# Patient Record
Sex: Female | Born: 1970
Health system: Southern US, Community
[De-identification: ages and names within clinical notes are randomized; demographics above are authoritative.]

## PROBLEM LIST (undated history)

## (undated) DIAGNOSIS — J309 Allergic rhinitis, unspecified: Secondary | ICD-10-CM

## (undated) DIAGNOSIS — I10 Essential (primary) hypertension: Secondary | ICD-10-CM

## (undated) DIAGNOSIS — J45909 Unspecified asthma, uncomplicated: Secondary | ICD-10-CM

## (undated) DIAGNOSIS — K2100 Gastro-esophageal reflux disease with esophagitis, without bleeding: Secondary | ICD-10-CM

## (undated) DIAGNOSIS — K219 Gastro-esophageal reflux disease without esophagitis: Secondary | ICD-10-CM

## (undated) DIAGNOSIS — K769 Liver disease, unspecified: Secondary | ICD-10-CM

## (undated) DIAGNOSIS — F419 Anxiety disorder, unspecified: Secondary | ICD-10-CM

## (undated) HISTORY — DX: Allergic rhinitis, unspecified: J30.9

## (undated) HISTORY — DX: Unspecified asthma, uncomplicated: J45.909

## (undated) HISTORY — PX: OTHER SURGICAL HISTORY: SHX169

## (undated) HISTORY — PX: CHOLECYSTECTOMY: SHX55

## (undated) HISTORY — DX: Liver disease, unspecified: K76.9

## (undated) HISTORY — DX: Gastro-esophageal reflux disease with esophagitis, without bleeding: K21.00

## (undated) HISTORY — DX: Gastro-esophageal reflux disease with esophagitis: K21.0

## (undated) HISTORY — DX: Anxiety disorder, unspecified: F41.9

---

## 1996-05-31 HISTORY — PX: TUBAL LIGATION: SHX77

## 2000-12-07 ENCOUNTER — Other Ambulatory Visit: Admission: RE | Admit: 2000-12-07 | Discharge: 2000-12-07 | Payer: Self-pay | Admitting: Family Medicine

## 2004-11-16 ENCOUNTER — Emergency Department: Payer: Self-pay | Admitting: Emergency Medicine

## 2005-11-18 ENCOUNTER — Emergency Department: Payer: Self-pay | Admitting: Emergency Medicine

## 2006-09-23 DIAGNOSIS — J309 Allergic rhinitis, unspecified: Secondary | ICD-10-CM | POA: Insufficient documentation

## 2006-09-23 DIAGNOSIS — J45909 Unspecified asthma, uncomplicated: Secondary | ICD-10-CM | POA: Insufficient documentation

## 2006-09-23 DIAGNOSIS — K2100 Gastro-esophageal reflux disease with esophagitis, without bleeding: Secondary | ICD-10-CM | POA: Insufficient documentation

## 2009-05-27 ENCOUNTER — Ambulatory Visit: Payer: Self-pay | Admitting: Family Medicine

## 2009-10-28 ENCOUNTER — Ambulatory Visit: Payer: Self-pay

## 2011-11-17 ENCOUNTER — Emergency Department: Payer: Self-pay | Admitting: Emergency Medicine

## 2011-11-17 LAB — HEPATIC FUNCTION PANEL A (ARMC)
Albumin: 3.6 g/dL (ref 3.4–5.0)
Bilirubin, Direct: <0.1 mg/dL (ref 0.00–0.20)
Bilirubin,Total: 0.2 mg/dL (ref 0.2–1.0)
SGOT(AST): 34 U/L (ref 15–37)
SGPT (ALT): 44 U/L

## 2011-11-17 LAB — URINALYSIS, COMPLETE
Glucose,UR: NEGATIVE mg/dL (ref 0–75)
Leukocyte Esterase: NEGATIVE
Nitrite: NEGATIVE
RBC,UR: 1 /HPF (ref 0–5)
Specific Gravity: 1.013 (ref 1.003–1.030)
WBC UR: NONE SEEN /HPF (ref 0–5)

## 2011-11-17 LAB — CBC
HGB: 15 g/dL (ref 12.0–16.0)
MCH: 31.2 pg (ref 26.0–34.0)
MCV: 93 fL (ref 80–100)
RDW: 13.4 % (ref 11.5–14.5)
WBC: 10.9 10*3/uL (ref 3.6–11.0)

## 2011-11-17 LAB — BASIC METABOLIC PANEL
BUN: 12 mg/dL (ref 7–18)
Calcium, Total: 8.8 mg/dL (ref 8.5–10.1)
Chloride: 108 mmol/L — ABNORMAL HIGH (ref 98–107)
Co2: 23 mmol/L (ref 21–32)
Creatinine: 0.68 mg/dL (ref 0.60–1.30)
EGFR (Non-African Amer.): >60
Glucose: 103 mg/dL — ABNORMAL HIGH (ref 65–99)

## 2011-11-17 LAB — PREGNANCY, URINE: Pregnancy Test, Urine: NEGATIVE m[IU]/mL

## 2012-11-01 ENCOUNTER — Ambulatory Visit: Payer: Self-pay | Admitting: Family Medicine

## 2014-09-22 NOTE — Consult Note (Signed)
PATIENT NAME:  Hannah Roberson, Hannah Roberson MR#:  [id number] DATE OF BIRTH:  Jul 08, 1970  DATE OF CONSULTATION:  11/17/2011  REFERRING PHYSICIAN:  ED MD CONSULTING PHYSICIAN:  Consuela Mimes, MD  HISTORY OF PRESENT ILLNESS: Hannah Roberson is a 44 year old white female with a three day history of lower back pain that radiates around her flank and onto her abdomen in a tire-like distribution. She says that she has these types of pains typically but they typically last less than a day. She was concerned because this episode lasted for three days. She does not have any nausea or any vomiting. She had a normal bowel movement yesterday and continues to pass flatus. She denies all genitourinary symptoms, fever, chills, and night sweats. She also denies change in the color of her urine or her stool or her eyes or her skin. The back portion of her pain does not really radiate anywhere other than around her flank and all over her upper abdomen.   PAST MEDICAL HISTORY:  1. History of asthmatic bronchitis with bronchospasm.  2. 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   MEDICATIONS:  1. Advil 800 mg q.8 hours. 2. Omeprazole 20 mg daily.  3. ProAir HFA 2 puffs daily.   ALLERGIES: Shellfish and fish.   SOCIAL HISTORY: The patient has a long-term (greater than 20 year monogamous) relationship and lives with that individual and one of their children. She works in Programmer, applications for [employer] She smokes a half a pack of cigarettes per day and does not drink alcohol.   REVIEW OF SYSTEMS: Negative for 10 systems except as mentioned above in the history of present illness. Specifically, see all of the negative symptoms in the history of present illness.   FAMILY HISTORY: Noncontributory.   PHYSICAL EXAMINATION:   GENERAL: Pleasant, obese young female in no distress standing up in  the Emergency Department room. Height 5 feet 2 inches, weight 200 pounds, BMI 36.6.   VITAL SIGNS: Temperature 97.0, pulse 94, respirations 18, blood pressure 184/90, oxygen saturation 100% on room air.   HEENT: Pupils equally round and reactive to light. Extraocular movements intact. Sclerae nonicteric. Oropharynx clear. Mucous membranes moist. Hearing is intact to voice.   NECK: Supple with no tracheal deviation or jugular venous distention.   HEART: Regular rate and rhythm with no murmurs or rubs.   LUNGS: Clear to auscultation with normal respiratory effort bilaterally.   ABDOMEN: Obese with large wide scar in the right subcostal region extending over to the flank. The abdomen is entirely nontender and nondistended.   EXTREMITIES: No edema with normal capillary refill bilaterally.   NEUROLOGIC: Motor and sensation grossly intact.   PSYCHIATRIC: Alert and oriented x4. Appropriate affect. The patient does express reasonable concern about her pain as she says that this is the same type of pain that she had when her bile duct had pathology and she required bile duct surgery.   OTHER STUDIES: White blood cell count 10.9, hemoglobin 15, hematocrit 44.5%, platelet count 266,000. Urinalysis normal. Urine pregnancy test negative. Electrolytes normal. Hepatic profile including SGOT, SGPT, alkaline phosphatase, and total bilirubin are all normal. Specifically, her total bilirubin is 0.2.   CT scan of the abdomen and pelvis with contrast was obtained and there appeared to be a small bowel anastomotic suture line in the anterior midabdomen with some focal bowel wall thickening. There was  no evidence of bowel obstruction. I reviewed the CT scan. What was not mentioned on the report was that the patient clearly has at least a left hepaticojejunostomy Roux-en-Y limb with a coronary marker attached to the anterior abdominal wall and there is air within this limb and the limb itself is quite decompressed.  There is also left hepatic ductal dilatation with air within the left hepatic duct and very little if any contrast within the left hepatic lobe (on the venous phase there is no left portal vein seen).   ASSESSMENT:  1. 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 I did not appreciate the subtle findings of thickening of the bowel wall at the enteroenterostomy, however.  2. Back pain likely due to some other etiology than her previous complex biliary reconstructive surgery.   I discussed this case with an attending surgeon at [facility] and she suggested that if the patient requires a follow-up that she follow-up with Dr. Estill Dooms in the future.   ____________________________ Consuela Mimes, MD wfm:drc D: 11/17/2011 14:22:38 ET T: 11/17/2011 14:32:31 ET JOB#: [id number]  cc: Consuela Mimes, MD, <Dictator> Dr. Estill Dooms, [place] MD ELECTRONICALLY SIGNED 11/18/2011 7:36

## 2014-09-22 NOTE — Consult Note (Signed)
Brief Consult Note: Diagnosis: back pain, unrelated to complex biliary reconstructions at [facility]   Patient was seen by consultant.   Consult note dictated.   Discussed with Attending MD.  Electronic Signatures: Consuela Mimes (MD)  (Signed 19-Jun-13 14:24)  Authored: Brief Consult Note   Last Updated: 19-Jun-13 14:24 by Consuela Mimes (MD)

## 2014-09-27 DIAGNOSIS — R351 Nocturia: Secondary | ICD-10-CM | POA: Insufficient documentation

## 2014-09-27 DIAGNOSIS — N3281 Overactive bladder: Secondary | ICD-10-CM | POA: Insufficient documentation

## 2014-09-27 DIAGNOSIS — R350 Frequency of micturition: Secondary | ICD-10-CM | POA: Insufficient documentation

## 2014-09-27 DIAGNOSIS — E66811 Obesity, class 1: Secondary | ICD-10-CM | POA: Insufficient documentation

## 2015-05-04 ENCOUNTER — Other Ambulatory Visit: Payer: Self-pay | Admitting: Family Medicine

## 2015-05-19 ENCOUNTER — Encounter: Payer: Self-pay | Admitting: Family Medicine

## 2015-05-19 ENCOUNTER — Ambulatory Visit (INDEPENDENT_AMBULATORY_CARE_PROVIDER_SITE_OTHER): Payer: BLUE CROSS/BLUE SHIELD | Admitting: Family Medicine

## 2015-05-19 ENCOUNTER — Other Ambulatory Visit: Payer: Self-pay | Admitting: Family Medicine

## 2015-05-19 VITALS — BP 112/82 | HR 68 | Temp 98.2°F | Resp 16 | Wt 239.8 lb

## 2015-05-19 DIAGNOSIS — N3090 Cystitis, unspecified without hematuria: Secondary | ICD-10-CM

## 2015-05-19 DIAGNOSIS — M545 Low back pain, unspecified: Secondary | ICD-10-CM

## 2015-05-19 LAB — POCT URINALYSIS DIPSTICK
Bilirubin, UA: NEGATIVE
Blood, UA: NEGATIVE
GLUCOSE UA: NEGATIVE
Ketones, UA: NEGATIVE
Leukocytes, UA: NEGATIVE
NITRITE UA: NEGATIVE
Protein, UA: NEGATIVE
Spec Grav, UA: 1.01
Urobilinogen, UA: 0.2
pH, UA: 5

## 2015-05-19 MED ORDER — NITROFURANTOIN MONOHYD MACRO 100 MG PO CAPS: 100.0000 mg | ORAL_CAPSULE | Freq: Two times a day (BID) | ORAL | Status: DC

## 2015-05-19 NOTE — Progress Notes (Signed)
Subjective:     Patient ID: Hannah Roberson, female   DOB: [birth year], 44 y.o.   MRN: [id number]  HPI  Chief Complaint  Patient presents with  . Back Pain    Patient comes in office today with concerns of urinary tract infection symptoms. Patient reports for the past 3 days she has felt "feverish", lower back pain, bilateral flank pain, pressure to void and urinary frequency. Patient states that she has taken otc Advil  Last documented UTI in 2013. States she had at least 20 oz of water today.   Review of Systems  Genitourinary: Negative for vaginal discharge.       Reports normal gyn exam 7 months ago.       Objective:   Physical Exam  Constitutional: She appears well-developed and well-nourished. No distress.  Genitourinary:  No cva tenderness       Assessment:    1. Bilateral low back pain without sciatica - POCT urinalysis dipstick  2. Cystitis - Urine culture - nitrofurantoin, macrocrystal-monohydrate, (MACROBID) 100 MG capsule; Take 1 capsule (100 mg total) by mouth 2 (two) times daily.  Dispense: 14 capsule; Refill: 0    Plan:    Further f/u pending culture report.

## 2015-05-19 NOTE — Patient Instructions (Signed)
We will call you with the urine culture result. 

## 2015-05-21 LAB — URINE CULTURE

## 2015-05-23 ENCOUNTER — Other Ambulatory Visit: Payer: Self-pay | Admitting: Family Medicine

## 2015-05-23 ENCOUNTER — Telehealth: Payer: Self-pay

## 2015-05-23 DIAGNOSIS — B3731 Acute candidiasis of vulva and vagina: Secondary | ICD-10-CM

## 2015-05-23 DIAGNOSIS — B373 Candidiasis of vulva and vagina: Secondary | ICD-10-CM

## 2015-05-23 DIAGNOSIS — N3090 Cystitis, unspecified without hematuria: Secondary | ICD-10-CM

## 2015-05-23 MED ORDER — FLUCONAZOLE 150 MG PO TABS: 150.0000 mg | ORAL_TABLET | Freq: Once | ORAL | Status: DC

## 2015-05-23 MED ORDER — SULFAMETHOXAZOLE-TRIMETHOPRIM 800-160 MG PO TABS: 1.0000 | ORAL_TABLET | Freq: Two times a day (BID) | ORAL | Status: DC

## 2015-05-23 NOTE — Telephone Encounter (Signed)
Advised patient as below. Patient reports that she has a yeast infection while on the other abx. She is requesting that medication be sent into the pharmacy. Thanks!

## 2015-05-23 NOTE — Telephone Encounter (Signed)
Diflucan sent in

## 2015-05-23 NOTE — Telephone Encounter (Signed)
[id number] 

## 2015-05-23 NOTE — Telephone Encounter (Signed)
-----   Message from Carmon Ginsberg, [place] sent at 05/23/2015  7:50 AM EST ----- Mild growth of Proteus which is resistant to Macrobid. Will change your antibiotic and send it in.

## 2015-05-23 NOTE — Telephone Encounter (Signed)
Patient has been advised. KW 

## 2015-06-05 ENCOUNTER — Emergency Department: Payer: BLUE CROSS/BLUE SHIELD

## 2015-06-05 ENCOUNTER — Ambulatory Visit: Payer: BLUE CROSS/BLUE SHIELD | Admitting: Family Medicine

## 2015-06-05 ENCOUNTER — Encounter: Payer: Self-pay | Admitting: Emergency Medicine

## 2015-06-05 ENCOUNTER — Emergency Department
Admission: EM | Admit: 2015-06-05 | Discharge: 2015-06-05 | Disposition: A | Discharge status: Home / Self Care | Payer: BLUE CROSS/BLUE SHIELD | Attending: Emergency Medicine | Admitting: Emergency Medicine

## 2015-06-05 DIAGNOSIS — Z3202 Encounter for pregnancy test, result negative: Secondary | ICD-10-CM | POA: Diagnosis not present

## 2015-06-05 DIAGNOSIS — R000 Tachycardia, unspecified: Secondary | ICD-10-CM | POA: Diagnosis not present

## 2015-06-05 DIAGNOSIS — R109 Unspecified abdominal pain: Secondary | ICD-10-CM | POA: Diagnosis present

## 2015-06-05 DIAGNOSIS — R1084 Generalized abdominal pain: Secondary | ICD-10-CM | POA: Diagnosis not present

## 2015-06-05 DIAGNOSIS — Z87891 Personal history of nicotine dependence: Secondary | ICD-10-CM | POA: Diagnosis not present

## 2015-06-05 DIAGNOSIS — Z79899 Other long term (current) drug therapy: Secondary | ICD-10-CM | POA: Diagnosis not present

## 2015-06-05 DIAGNOSIS — E669 Obesity, unspecified: Secondary | ICD-10-CM | POA: Diagnosis not present

## 2015-06-05 LAB — COMPREHENSIVE METABOLIC PANEL
ALBUMIN: 4 g/dL (ref 3.5–5.0)
ALK PHOS: 121 U/L (ref 38–126)
ALT: 57 U/L — AB (ref 14–54)
AST: 39 U/L (ref 15–41)
Anion gap: 9 (ref 5–15)
BUN: 6 mg/dL (ref 6–20)
CALCIUM: 9 mg/dL (ref 8.9–10.3)
CHLORIDE: 103 mmol/L (ref 101–111)
CO2: 25 mmol/L (ref 22–32)
CREATININE: 0.54 mg/dL (ref 0.44–1.00)
GFR calc Af Amer: >60 mL/min (ref >60)
GFR calc non Af Amer: >60 mL/min (ref >60)
GLUCOSE: 119 mg/dL — AB (ref 65–99)
Potassium: 3.8 mmol/L (ref 3.5–5.1)
SODIUM: 137 mmol/L (ref 135–145)
Total Bilirubin: 1 mg/dL (ref 0.3–1.2)
Total Protein: 7.8 g/dL (ref 6.5–8.1)

## 2015-06-05 LAB — URINALYSIS COMPLETE WITH MICROSCOPIC (ARMC ONLY)
BACTERIA UA: NONE SEEN
Bilirubin Urine: NEGATIVE
GLUCOSE, UA: NEGATIVE mg/dL
Ketones, ur: NEGATIVE mg/dL
Leukocytes, UA: NEGATIVE
NITRITE: NEGATIVE
PROTEIN: NEGATIVE mg/dL
SPECIFIC GRAVITY, URINE: 1.015 (ref 1.005–1.030)
pH: 7 (ref 5.0–8.0)

## 2015-06-05 LAB — LIPASE, BLOOD: LIPASE: 20 U/L (ref 11–51)

## 2015-06-05 LAB — CBC
HCT: 36.4 % (ref 35.0–47.0)
HEMOGLOBIN: 11.7 g/dL — AB (ref 12.0–16.0)
MCH: 25.5 pg — AB (ref 26.0–34.0)
MCHC: 32.2 g/dL (ref 32.0–36.0)
MCV: 79.4 fL — ABNORMAL LOW (ref 80.0–100.0)
PLATELETS: 310 10*3/uL (ref 150–440)
RBC: 4.58 MIL/uL (ref 3.80–5.20)
RDW: 15.3 % — ABNORMAL HIGH (ref 11.5–14.5)
WBC: 18.1 10*3/uL — AB (ref 3.6–11.0)

## 2015-06-05 LAB — PREGNANCY, URINE: PREG TEST UR: NEGATIVE

## 2015-06-05 MED ORDER — HYDROMORPHONE HCL 1 MG/ML IJ SOLN
1.0000 mg | Freq: Once | INTRAMUSCULAR | Status: AC
  Administered 2015-06-05: 1 mg via INTRAVENOUS
  Filled 2015-06-05: qty 1

## 2015-06-05 MED ORDER — DICYCLOMINE HCL 10 MG PO CAPS: 10.0000 mg | ORAL_CAPSULE | Freq: Four times a day (QID) | ORAL | Status: DC

## 2015-06-05 MED ORDER — ONDANSETRON HCL 4 MG PO TABS: 4.0000 mg | ORAL_TABLET | Freq: Three times a day (TID) | ORAL | Status: DC | PRN

## 2015-06-05 MED ORDER — HYDROCODONE-ACETAMINOPHEN 5-325 MG PO TABS: 1.0000 | ORAL_TABLET | Freq: Four times a day (QID) | ORAL | Status: DC | PRN

## 2015-06-05 MED ORDER — ONDANSETRON HCL 4 MG/2ML IJ SOLN
4.0000 mg | Freq: Once | INTRAMUSCULAR | Status: AC
  Administered 2015-06-05: 4 mg via INTRAVENOUS
  Filled 2015-06-05: qty 2

## 2015-06-05 MED ORDER — IOHEXOL 300 MG/ML  SOLN
100.0000 mL | Freq: Once | INTRAMUSCULAR | Status: AC | PRN
  Administered 2015-06-05: 100 mL via INTRAVENOUS

## 2015-06-05 MED ORDER — IOHEXOL 240 MG/ML SOLN
25.0000 mL | Freq: Once | INTRAMUSCULAR | Status: AC | PRN
  Administered 2015-06-05: 25 mL via ORAL

## 2015-06-05 NOTE — ED Provider Notes (Signed)
[facility] Emergency Department Provider Note   ____________________________________________  Time seen: I have reviewed the triage vital signs and the triage nursing note.  HISTORY  Chief Complaint Abdominal Pain   Historian Patient  HPI Hannah Roberson is a 45 y.o. female who is here for mid abdomen sharp poking abdominal pain. She's also had abdominal swelling. She feels like her abdomen is tense. She's had some intermittent pain for a couple of days, but last 12 hours as been much worse. Of note she is recently been treated with a UTI, initially started on one and then called by her physician that there was resistance, she was placed onto a different about it. She does not remember the names.  No fever. No dysuria. She's had a history of 2 surgeries on her liver in the past. She's never had a bowel obstruction. Nothing makes the pain better or worse. Pain is rated as severe.    Past Medical History  Diagnosis Date  . Allergic rhinitis   . Asthma, extrinsic   . Esophagitis, reflux     Patient Active Problem List   Diagnosis Date Noted  . Allergic rhinitis 09/23/2006  . Asthma, exogenous 09/23/2006  . Esophagitis, reflux 09/23/2006    Past Surgical History  Procedure Laterality Date  . Cholecystectomy    . Tubal ligation  1998  . Bile duct obstruction      patient has had 2 surgeries on liver to obstruction     Current Outpatient Rx  Name  Route  Sig  Dispense  Refill  . albuterol (PROAIR HFA) 108 (90 BASE) MCG/ACT inhaler   Inhalation   Inhale 2 puffs into the lungs every 6 (six) hours as needed for wheezing or shortness of breath. Reported on 05/19/2015         . esomeprazole (NEXIUM) 40 MG capsule   Oral   Take 40 mg by mouth daily at 12 noon.         . Esomeprazole Magnesium (NEXIUM 24HR PO)   Oral   Take 44.6 mg by mouth daily as needed (for acid reflux). Takes OTC Nexium         . fluticasone (FLONASE) 50 MCG/ACT nasal  spray      PLACE 2 SPRAYS INTO EACH NOSTRIL DAILY   16 g   3   . dicyclomine (BENTYL) 10 MG capsule   Oral   Take 1 capsule (10 mg total) by mouth 4 (four) times daily.   56 capsule   0   . HYDROcodone-acetaminophen (NORCO/VICODIN) 5-325 MG tablet   Oral   Take 1 tablet by mouth every 6 (six) hours as needed for moderate pain.   8 tablet   0   . ondansetron (ZOFRAN) 4 MG tablet   Oral   Take 1 tablet (4 mg total) by mouth every 8 (eight) hours as needed for nausea or vomiting.   10 tablet   0     Allergies Review of patient's allergies indicates no known allergies.  Family History  Problem Relation Age of Onset  . Healthy Mother   . Diabetes Sister   . Diabetes Brother   . Cancer Maternal Grandfather     lung cancer    Social History Social History  Substance Use Topics  . Smoking status: Former Research scientist (life sciences)  . Smokeless tobacco: Never Used  . Alcohol Use: 0.0 oz/week    0 Standard drinks or equivalent per week    Review of Systems  Constitutional:  Negative for fever. Eyes: Negative for visual changes. ENT: Negative for sore throat. Cardiovascular: Negative for chest pain. Respiratory: Negative for shortness of breath. Gastrointestinal: Abdominal yesterday and so took a laxative pill, today she had a tiny bit of runny stool. No vomiting but positive for belching which makes her feel a little bit better.. Genitourinary: Negative for dysuria. Musculoskeletal: Negative for back pain. Skin: Negative for rash. Neurological: Negative for headache. 10 point Review of Systems otherwise negative ____________________________________________   PHYSICAL EXAM:  VITAL SIGNS: ED Triage Vitals  Enc Vitals Group     BP 06/05/15 1058 149/88 mmHg     Pulse Rate 06/05/15 1058 125     Resp 06/05/15 1058 20     Temp 06/05/15 1058 97.7 F (36.5 C)     Temp Source 06/05/15 1058 Oral     SpO2 06/05/15 1058 98 %     Weight 06/05/15 1058 230 lb (104.327 kg)     Height  06/05/15 1058 5\' 2"  (1.575 m)     Head Cir --      Peak Flow --      Pain Score --      Pain Loc --      Pain Edu? --      Excl. in [place]? --      Constitutional: Alert and oriented. Standing up walking around holding her abdomen and pain. Eyes: Conjunctivae are normal. PERRL. Normal extraocular movements. ENT   Head: Normocephalic and atraumatic.   Nose: No congestion/rhinnorhea.   Mouth/Throat: Mucous membranes are moist.   Neck: No stridor. Cardiovascular/Chest: Tachycardic, regular. No murmurs, rubs, or gallops. Respiratory: Normal respiratory effort without tachypnea nor retractions. Breath sounds are clear and equal bilaterally. No wheezes/rales/rhonchi. Gastrointestinal:  Obese but tender and somewhat distended. Positive for guarding and no rebound.  Genitourinary/rectal:Deferred Musculoskeletal: Nontender with normal range of motion in all extremities. No joint effusions.  No lower extremity tenderness.  No edema. Neurologic:  Normal speech and language. No gross or focal neurologic deficits are appreciated. Skin:  Skin is warm, dry and intact. No rash noted. Psychiatric: Mood and affect are normal. Speech and behavior are normal. Patient exhibits appropriate insight and judgment.  ____________________________________________   EKG I, Lisa Roca, MD, the attending physician have personally viewed and interpreted all ECGs.  122 bpm. Sinus tachycardia. Narrow QRS. Normal axis. Nonspecific ST and T-wave ____________________________________________  LABS (pertinent positives/negatives)  Lipase 20 White blood cell count 18.1, hemoglobin 11.7 and platelet count 310 Comprehensive metabolic panel without significant abnormality Urinalysis negative  ____________________________________________  RADIOLOGY All Xrays were viewed by me. Imaging interpreted by Radiologist.  Acute abdomen with chest: Negative abdominal rhythm distress. No acute cardio pulmonary  disease  CT abdomen and pelvis with contrast:  IMPRESSION: No acute abnormality identified in the abdomen and pelvis. There is no bowel obstruction or diverticulitis.  Fatty infiltration of liver.  Status post prior cholecystectomy with pneumobilia unchanged. __________________________________________  PROCEDURES  Procedure(s) performed: None  Critical Care performed: None  ____________________________________________   ED COURSE / ASSESSMENT AND PLAN  CONSULTATIONS: None  Pertinent labs & imaging results that were available during my care of the patient were reviewed by me and considered in my medical decision making (see chart for details).  Patient's history and abdominal exam are concerning for intra-abdominal abdominal medical/surgical emergency, with obstruction of the top my list. She doesn't have rebound, however and obtain a acute abdomen with chest to rule out free air and then if reassuring from that standpoint,  proceed with CT of abdomen and pelvis.  ----------------------------------------- 3:35 PM on 06/05/2015 -----------------------------------------  Imaging reviewed, and no acute findings to explain the patient's pain.   Patient feels much better at this point time. She actually states that she's had numerous episodes like this in the past, although this was more severe. Other times has happened after she was treated with antibiotics for urinary tract infection as well. We discussed the possibility of that display emesis, and recommended probiotic use.   We discussed GERD/stomach ulcer, and I will prescribe Bentyl and referred to gastroenterology.  We discussed the possibility of C. difficile, although she's not had any diarrhea, and no fever, and I asked her to return to the department if she has any diarrhea for testing of this.  Patient / Family / Caregiver informed of clinical course, medical decision-making process, and agree with  plan.    ___________________________________________   FINAL CLINICAL IMPRESSION(S) / ED DIAGNOSES   Final diagnoses:  Generalized abdominal pain       Lisa Roca, MD 06/05/15 1614

## 2015-06-05 NOTE — ED Notes (Signed)
Pt states abd pain radiating to her back, states it feels like a hot poker, states pain for 3 days but increasing in the past 12 hours, states she was just recently treated for a UTI and finished abx, states nausea from the pain, denies any blood in her urine but states she is on her period

## 2015-06-05 NOTE — Discharge Instructions (Signed)
You were evaluated for abdominal pain, and no certain cause was found, however your CT scan of the abdomen was reassuring.  We discussed you may want to try over-the-counter magnesium citrate for relief of constipation. We also discussed probiotics to help rebuild gut flora after recent antibiotic use.  Return to emergency department for any worsening condition including any fever, worsening pain, black or bloody diarrhea, or diarrhea at all, as you would need to have a test for the bacteria called C. Difficile.   Abdominal Pain, Adult Many things can cause belly (abdominal) pain. Most times, the belly pain is not dangerous. Many cases of belly pain can be watched and treated at home. HOME CARE   Do not take medicines that help you go poop (laxatives) unless told to by your doctor.  Only take medicine as told by your doctor.  Eat or drink as told by your doctor. Your doctor will tell you if you should be on a special diet. GET HELP IF:  You do not know what is causing your belly pain.  You have belly pain while you are sick to your stomach (nauseous) or have runny poop (diarrhea).  You have pain while you pee or poop.  Your belly pain wakes you up at night.  You have belly pain that gets worse or better when you eat.  You have belly pain that gets worse when you eat fatty foods.  You have a fever. GET HELP RIGHT AWAY IF:   The pain does not go away within 2 hours.  You keep throwing up (vomiting).  The pain changes and is only in the right or left part of the belly.  You have bloody or tarry looking poop. MAKE SURE YOU:   Understand these instructions.  Will watch your condition.  Will get help right away if you are not doing well or get worse.   This information is not intended to replace advice given to you by your health care provider. Make sure you discuss any questions you have with your health care provider.   Document Released: 11/03/2007 Document Revised:  06/07/2014 Document Reviewed: 01/24/2013 Elsevier Interactive Patient Education [employer].

## 2015-06-05 NOTE — ED Notes (Signed)
Says tx for uti recently, and now has severe abd painfor 2 days.

## 2015-06-12 ENCOUNTER — Telehealth: Payer: Self-pay | Admitting: Family Medicine

## 2015-06-12 NOTE — Telephone Encounter (Signed)
Pt stated that she would like Bob to return her call. Pt stated she went to the hospital last week and she would like him to check on something for her. Pt wouldn't be more detailed. Thanks TNP

## 2015-06-13 NOTE — Telephone Encounter (Signed)
Please review. Thanks!  

## 2015-06-13 NOTE — Telephone Encounter (Signed)
States abdominal pain is improved but due to hx of recurrence will be going back to [facility] Will let us know if she needs a referral.

## 2015-07-08 ENCOUNTER — Encounter: Payer: Self-pay | Admitting: Family Medicine

## 2015-09-30 ENCOUNTER — Other Ambulatory Visit: Payer: Self-pay | Admitting: Obstetrics and Gynecology

## 2015-09-30 DIAGNOSIS — Z1231 Encounter for screening mammogram for malignant neoplasm of breast: Secondary | ICD-10-CM

## 2015-10-08 ENCOUNTER — Ambulatory Visit
Admission: RE | Admit: 2015-10-08 | Discharge: 2015-10-08 | Disposition: A | Discharge status: Home / Self Care | Payer: BLUE CROSS/BLUE SHIELD | Source: Ambulatory Visit | Attending: Obstetrics and Gynecology | Admitting: Obstetrics and Gynecology

## 2015-10-08 DIAGNOSIS — Z1231 Encounter for screening mammogram for malignant neoplasm of breast: Secondary | ICD-10-CM

## 2016-07-12 ENCOUNTER — Encounter: Payer: Self-pay | Admitting: Family Medicine

## 2016-07-12 ENCOUNTER — Ambulatory Visit (INDEPENDENT_AMBULATORY_CARE_PROVIDER_SITE_OTHER): Payer: BLUE CROSS/BLUE SHIELD | Admitting: Family Medicine

## 2016-07-12 VITALS — BP 108/84 | HR 96 | Temp 98.3°F | Resp 20

## 2016-07-12 DIAGNOSIS — J4520 Mild intermittent asthma, uncomplicated: Secondary | ICD-10-CM

## 2016-07-12 DIAGNOSIS — J0110 Acute frontal sinusitis, unspecified: Secondary | ICD-10-CM | POA: Diagnosis not present

## 2016-07-12 MED ORDER — ALBUTEROL SULFATE HFA 108 (90 BASE) MCG/ACT IN AERS: 2.0000 | INHALATION_SPRAY | Freq: Four times a day (QID) | RESPIRATORY_TRACT | 5 refills | Status: DC | PRN

## 2016-07-12 MED ORDER — PREDNISONE 20 MG PO TABS: ORAL_TABLET | ORAL | 1 refills | Status: DC

## 2016-07-12 MED ORDER — AMOXICILLIN-POT CLAVULANATE 875-125 MG PO TABS: 1.0000 | ORAL_TABLET | Freq: Two times a day (BID) | ORAL | 0 refills | Status: DC

## 2016-07-12 NOTE — Patient Instructions (Addendum)
Add Mucinex D for congestion and Delsym for cough.

## 2016-07-12 NOTE — Progress Notes (Signed)
Subjective:     Patient ID: Hannah Roberson, female   DOB: 1971/01/20, 46 y.o.   MRN: [id number]  HPI  Chief Complaint  Patient presents with  . URI    x 1 week. Pt c/o productive cough (green sputum), fever of 101 this morning (pt took Advil), right otalgia, dizziness, SOB. Pt has a H/O asthma. Pt needs refill on ProAir. Pt is c/o wheezing, facial pain, headache.  Patient reports increased frontal sinus pressure, purulent sinus drainage, post nasal drainage and accompanying cough.States her asthma is flaring up and she is using her inhaler frequently. Reports persistent decreased hearing in her right ear.   Review of Systems     Objective:   Physical Exam  Constitutional: She appears well-developed and well-nourished. No distress.  Ears: T.M's intact without inflammation Sinuses: mild frontal sinus tenderness Throat: no tonsillar enlargement or exudate Neck: no cervical adenopathy Lungs: clear except transient expiratory wheeze.     Assessment:    1. Acute non-recurrent frontal sinusitis - amoxicillin-clavulanate (AUGMENTIN) 875-125 MG tablet; Take 1 tablet by mouth 2 (two) times daily.  Dispense: 20 tablet; Refill: 0  2. Mild intermittent extrinsic asthma without complication - albuterol (PROAIR HFA) 108 (90 Base) MCG/ACT inhaler; Inhale 2 puffs into the lungs every 6 (six) hours as needed for wheezing or shortness of breath. Reported on 05/19/2015  Dispense: 1 Inhaler; Refill: 5 - predniSONE (DELTASONE) 20 MG tablet; One pill twice daily  Dispense: 10 tablet; Refill: 1    Plan:    Discussed use of Mucinex D and Delsym. Consider ENT referral if ear not improving with prednisone.

## 2016-07-19 ENCOUNTER — Other Ambulatory Visit: Payer: Self-pay | Admitting: Family Medicine

## 2016-07-19 ENCOUNTER — Telehealth: Payer: Self-pay

## 2016-07-19 MED ORDER — FLUCONAZOLE 150 MG PO TABS: 150.0000 mg | ORAL_TABLET | Freq: Once | ORAL | 0 refills | Status: AC

## 2016-07-19 NOTE — Telephone Encounter (Signed)
Pt informed

## 2016-07-19 NOTE — Telephone Encounter (Signed)
Diflucan sent in

## 2016-07-19 NOTE — Telephone Encounter (Signed)
Spoke with patient on the phone she states since she has been prescribed Augmentin she has developed a yeast infection. Patient complains of external and internal vaginal itching, discharge and swelling around labia. Patient request that you send prescription in pill form and states [facility] [address]. KW

## 2016-07-19 NOTE — Telephone Encounter (Signed)
Patient called and states she is having issues after been on antibiotic, she did not want to tell me any details and just asked for the nurse or doctor to call her back-aa

## 2016-11-11 ENCOUNTER — Other Ambulatory Visit: Payer: Self-pay | Admitting: Obstetrics and Gynecology

## 2016-11-11 DIAGNOSIS — Z1231 Encounter for screening mammogram for malignant neoplasm of breast: Secondary | ICD-10-CM

## 2016-12-06 ENCOUNTER — Ambulatory Visit
Admission: RE | Admit: 2016-12-06 | Discharge: 2016-12-06 | Disposition: A | Discharge status: Home / Self Care | Payer: BLUE CROSS/BLUE SHIELD | Source: Ambulatory Visit | Attending: Obstetrics and Gynecology | Admitting: Obstetrics and Gynecology

## 2016-12-06 DIAGNOSIS — Z1231 Encounter for screening mammogram for malignant neoplasm of breast: Secondary | ICD-10-CM | POA: Diagnosis present

## 2017-01-03 IMAGING — MG MM DIGITAL SCREENING BILAT W/ CAD
5 series · 5 of 5 positions shown · non-contrast
Comparison: None.

CLINICAL DATA: Screening. Baseline exam.

EXAM:
DIGITAL SCREENING BILATERAL MAMMOGRAM WITH CAD

[R CC]
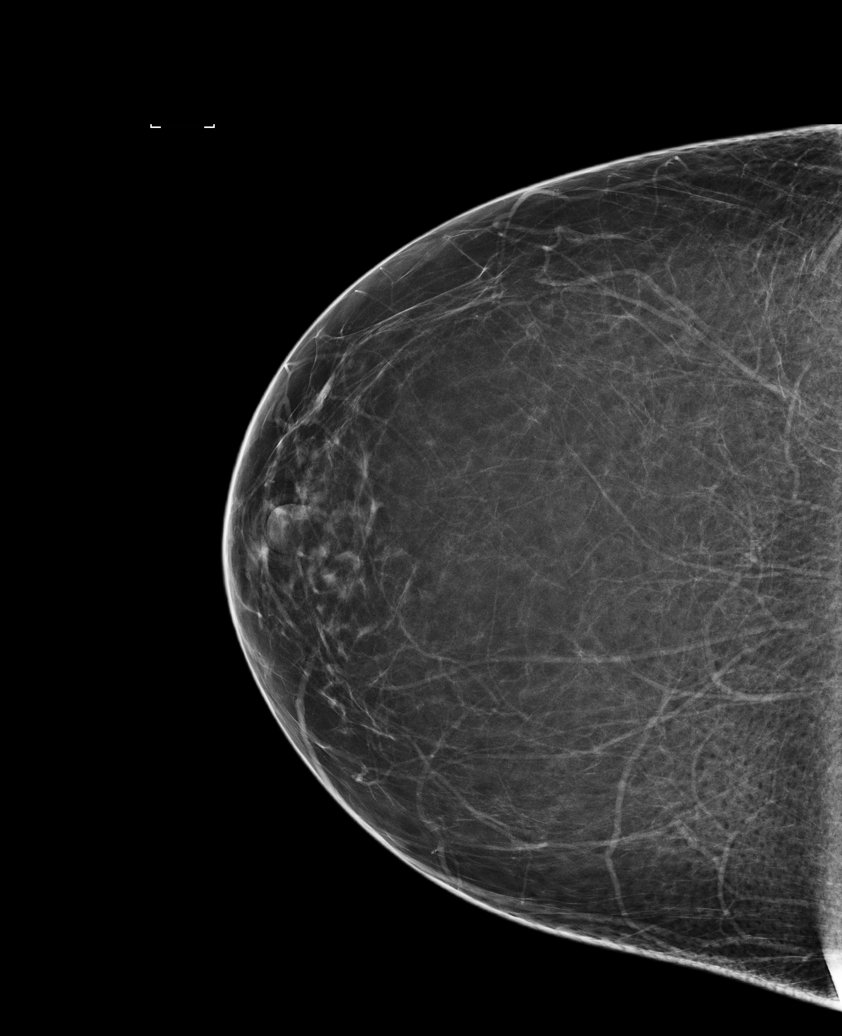

[R MLO (1 of 2)]
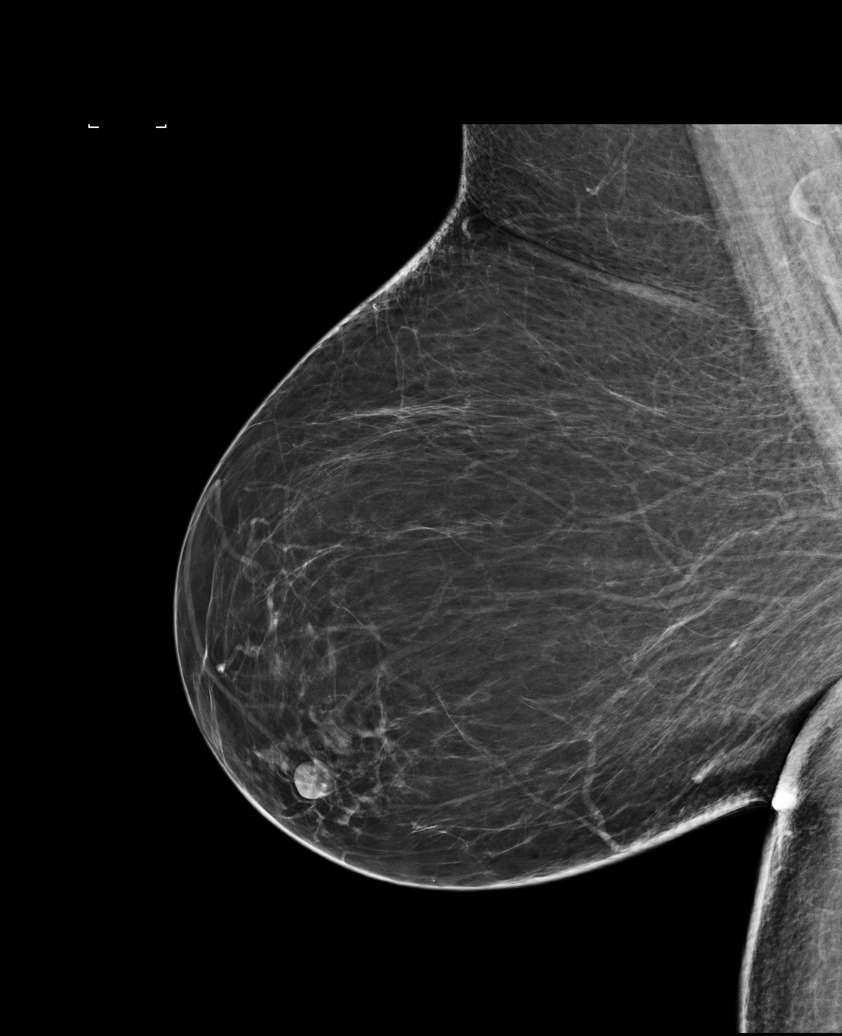

[L MLO]
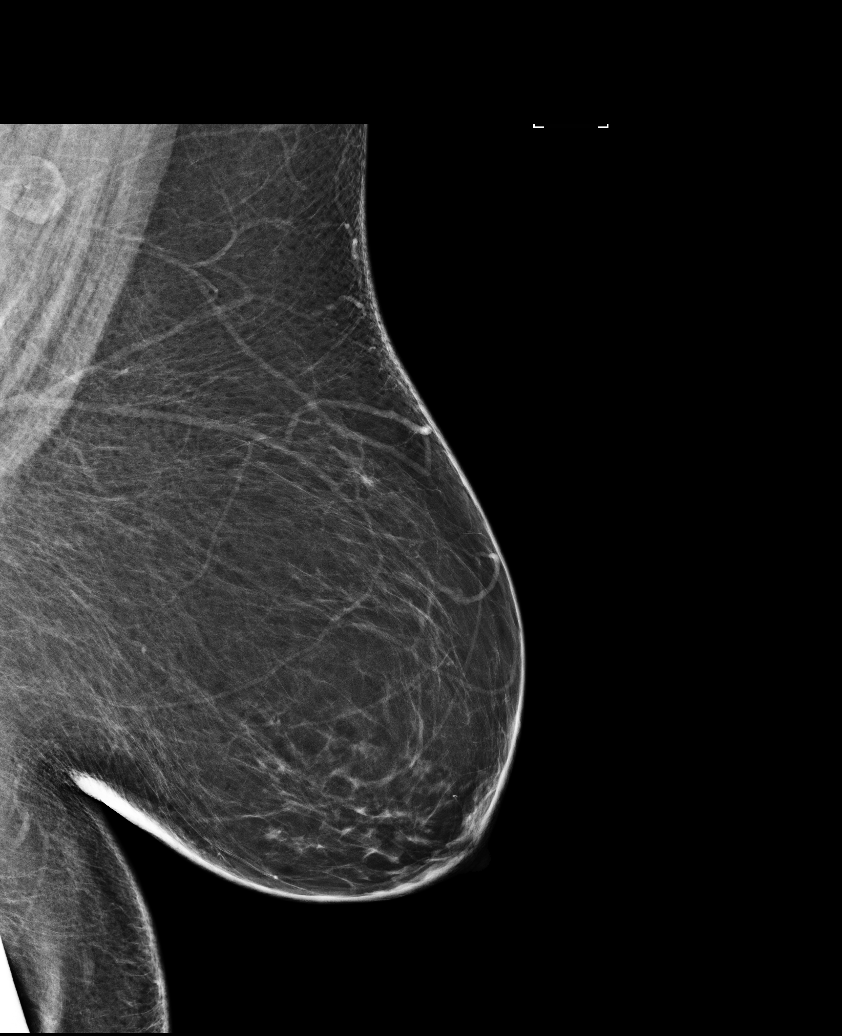

[R MLO (2 of 2)]
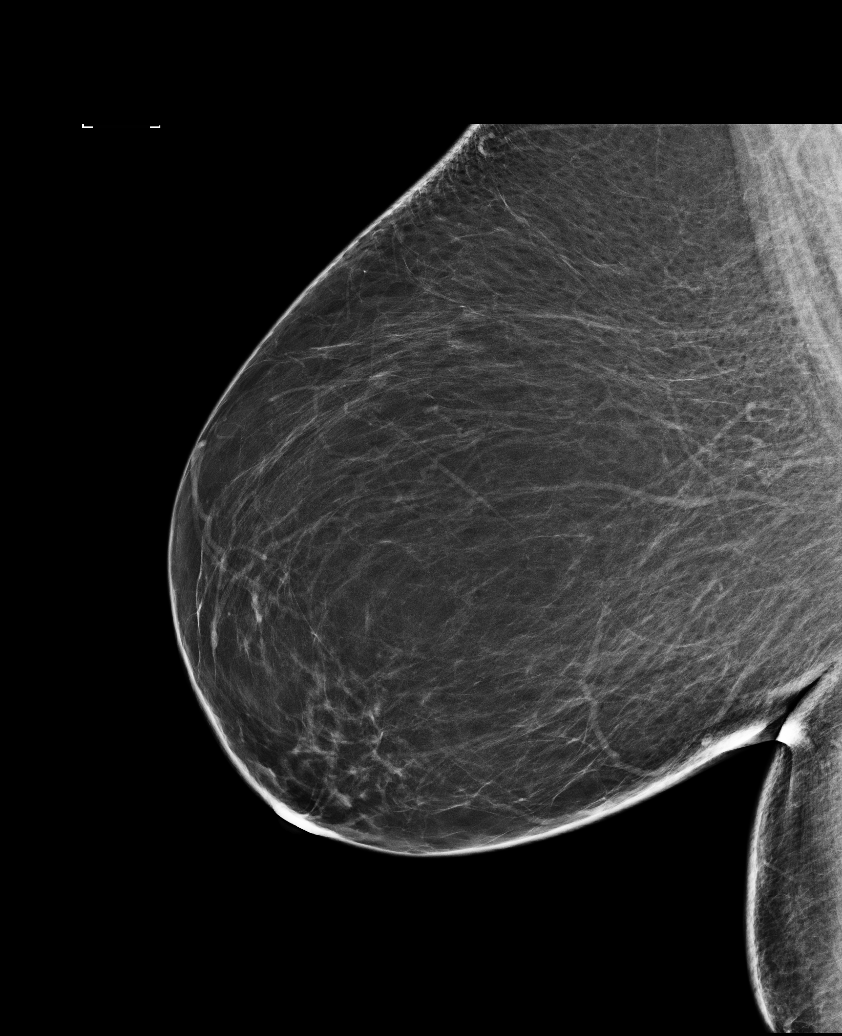

[L CC]
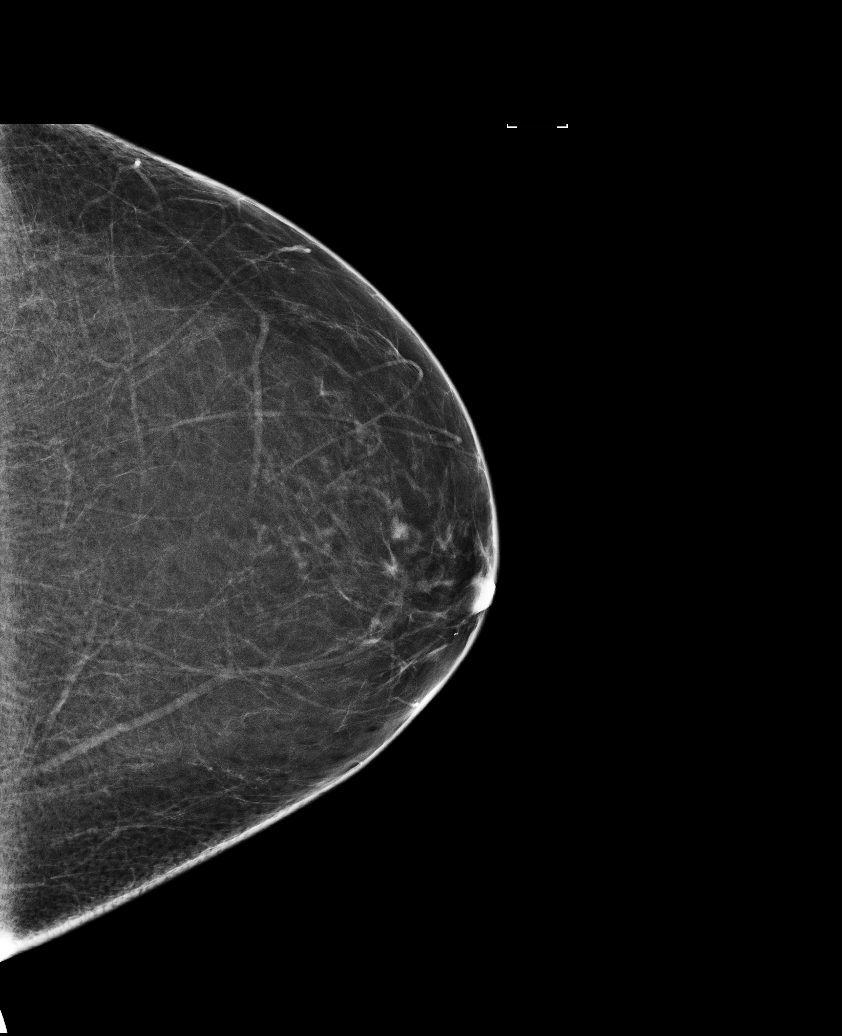

[5 of 5 positions shown; findings below may reference images not displayed]

ACR Breast Density Category b: There are scattered areas of
fibroglandular density.
FINDINGS: There are no findings suspicious for malignancy. Images were
processed with CAD.
IMPRESSION: No mammographic evidence of malignancy. A result letter of this
screening mammogram will be mailed directly to the patient.

RECOMMENDATION:
Screening mammogram in one year. (Code:SS-O-A03)

BI-RADS CATEGORY  1: Negative.

## 2017-03-14 ENCOUNTER — Other Ambulatory Visit: Payer: Self-pay | Admitting: Family Medicine

## 2017-12-07 ENCOUNTER — Encounter: Payer: Self-pay | Admitting: Family Medicine

## 2017-12-07 ENCOUNTER — Ambulatory Visit (INDEPENDENT_AMBULATORY_CARE_PROVIDER_SITE_OTHER): Payer: BLUE CROSS/BLUE SHIELD | Admitting: Family Medicine

## 2017-12-07 VITALS — BP 120/92 | HR 83 | Temp 98.3°F | Resp 16

## 2017-12-07 DIAGNOSIS — B9789 Other viral agents as the cause of diseases classified elsewhere: Secondary | ICD-10-CM

## 2017-12-07 DIAGNOSIS — J028 Acute pharyngitis due to other specified organisms: Secondary | ICD-10-CM

## 2017-12-07 DIAGNOSIS — J029 Acute pharyngitis, unspecified: Secondary | ICD-10-CM

## 2017-12-07 LAB — POCT RAPID STREP A (OFFICE): Rapid Strep A Screen: NEGATIVE

## 2017-12-07 MED ORDER — HYDROCODONE-HOMATROPINE 5-1.5 MG/5ML PO SYRP: 5.0000 mL | ORAL_SOLUTION | Freq: Four times a day (QID) | ORAL | 0 refills | Status: DC | PRN

## 2017-12-07 MED ORDER — HYDROCODONE-HOMATROPINE 5-1.5 MG/5ML PO SYRP: 5.0000 mL | ORAL_SOLUTION | Freq: Four times a day (QID) | ORAL | 0 refills | Status: AC | PRN

## 2017-12-07 NOTE — Progress Notes (Signed)
  Subjective:     Patient ID: Hannah Roberson, female   DOB: 03-Sep-1970, 47 y.o.   MRN: [id number] Chief Complaint  Patient presents with  . Sinus Problem    Patient comes in office today with complaints of sinus pain and pressure for the past 3 days. Patient reports bilateral ear pain, sore throat and cough productive of brownish/green phlegm. Patient states that she has been taking otc Advil for relief.    HPI Has felt feverish but has not documented a fever. No family members are ill.  Review of Systems     Objective:   Physical Exam  Constitutional: She appears well-developed and well-nourished. No distress.  Ears: T.M's intact with mild distortion of landmarks but no inflammation Throat: Moderately enlarged tonsils without erythema Neck: no cervical adenopathy Lungs: clear     Assessment:    1. Acute viral pharyngitis - POCT rapid strep A - HYDROcodone-homatropine (HYCODAN) 5-1.5 MG/5ML syrup; Take 5 mLs by mouth every 6 (six) hours as needed for up to 5 days. 5 ml 4-6 hours as needed for cough  Dispense: 100 mL; Refill: 0    Plan:    Discussed use of otc medication and scheduling albuterol while ill.

## 2017-12-07 NOTE — Patient Instructions (Addendum)
Discussed use of Mucinex D for congestion and Delsym for cough. Schedule albuterol twice daily while ill.

## 2017-12-22 ENCOUNTER — Other Ambulatory Visit: Payer: Self-pay | Admitting: Obstetrics and Gynecology

## 2017-12-22 DIAGNOSIS — Z1231 Encounter for screening mammogram for malignant neoplasm of breast: Secondary | ICD-10-CM

## 2017-12-27 ENCOUNTER — Other Ambulatory Visit: Payer: Self-pay | Admitting: Family Medicine

## 2017-12-27 DIAGNOSIS — J4520 Mild intermittent asthma, uncomplicated: Secondary | ICD-10-CM

## 2018-01-11 ENCOUNTER — Ambulatory Visit
Admission: RE | Admit: 2018-01-11 | Discharge: 2018-01-11 | Disposition: A | Discharge status: Home / Self Care | Payer: BLUE CROSS/BLUE SHIELD | Source: Ambulatory Visit | Attending: Obstetrics and Gynecology | Admitting: Obstetrics and Gynecology

## 2018-01-11 DIAGNOSIS — Z1231 Encounter for screening mammogram for malignant neoplasm of breast: Secondary | ICD-10-CM | POA: Insufficient documentation

## 2018-03-03 ENCOUNTER — Other Ambulatory Visit: Payer: Self-pay | Admitting: Family Medicine

## 2018-03-03 ENCOUNTER — Telehealth: Payer: Self-pay | Admitting: Family Medicine

## 2018-03-03 DIAGNOSIS — B001 Herpesviral vesicular dermatitis: Secondary | ICD-10-CM

## 2018-03-03 MED ORDER — VALACYCLOVIR HCL 1 G PO TABS: ORAL_TABLET | ORAL | 1 refills | Status: DC

## 2018-03-03 NOTE — Telephone Encounter (Signed)
I forgot name of otc treatment they have for fever blisters, but would you recommended that patient try that first?

## 2018-03-03 NOTE — Telephone Encounter (Signed)
Pt gets fever blisters frequently and she wants to know if there is something she can take to help not get them as frequently.  She feels like on is coming on now.  [facility] [place]  CB#  [phone]

## 2018-03-03 NOTE — Telephone Encounter (Signed)
I have sent in Valtrex to start right at onset of cold sore/fever blister symptoms.

## 2018-03-03 NOTE — Telephone Encounter (Signed)
Abreva is otc. How may times a year is she getting these and what does she usually do about it?

## 2018-03-03 NOTE — Telephone Encounter (Signed)
Patient reports that she has been having outbreaks 3-54yrs and in the past month has had two outbreaks. Patient states that she has only been using Abreva otc with no relief.

## 2018-08-02 ENCOUNTER — Ambulatory Visit: Payer: Managed Care, Other (non HMO) | Admitting: Physician Assistant

## 2018-08-02 ENCOUNTER — Encounter: Payer: Self-pay | Admitting: Physician Assistant

## 2018-08-02 VITALS — BP 161/96 | HR 82 | Temp 98.2°F | Resp 16

## 2018-08-02 DIAGNOSIS — N3000 Acute cystitis without hematuria: Secondary | ICD-10-CM

## 2018-08-02 DIAGNOSIS — R350 Frequency of micturition: Secondary | ICD-10-CM

## 2018-08-02 DIAGNOSIS — Z6841 Body Mass Index (BMI) 40.0 and over, adult: Secondary | ICD-10-CM

## 2018-08-02 LAB — POCT URINALYSIS DIPSTICK
BILIRUBIN UA: NEGATIVE
GLUCOSE UA: NEGATIVE
Ketones, UA: NEGATIVE
NITRITE UA: POSITIVE
Protein, UA: NEGATIVE
Spec Grav, UA: 1.01 (ref 1.010–1.025)
UROBILINOGEN UA: 0.2 U/dL
pH, UA: 6.5 (ref 5.0–8.0)

## 2018-08-02 MED ORDER — CEPHALEXIN 500 MG PO CAPS: 500.0000 mg | ORAL_CAPSULE | Freq: Two times a day (BID) | ORAL | 0 refills | Status: DC

## 2018-08-02 NOTE — Progress Notes (Signed)
Patient: Hannah Roberson Female    DOB: 1970/07/05   48 y.o.   MRN: [id number] Visit Date: 08/02/2018  Today's Provider: Mar Daring, PA-C   Chief Complaint  Patient presents with  . Urinary Tract Infection   Subjective:    I, Sulibeya S. Dimas, CMA, am acting as a Education administrator for [employer].  Urinary Tract Infection   The current episode started in the past 7 days. The problem occurs every urination. The problem has been unchanged. The quality of the pain is described as aching. The patient is experiencing no pain. There has been no fever. Associated symptoms include flank pain, frequency, hematuria, hesitancy and urgency. Pertinent negatives include no chills, discharge, nausea, possible pregnancy or vomiting. She has tried nothing for the symptoms. Her past medical history is significant for recurrent UTIs.    No Known Allergies   Current Outpatient Medications:  .  esomeprazole (NEXIUM) 40 MG capsule, Take 40 mg by mouth daily at 12 noon., Disp: , Rfl:  .  PROAIR HFA 108 (90 Base) MCG/ACT inhaler, INHALE 2 PUFFS INTO LUNGS EVERY 6 HOURS AS NEEDED FOR WHEEZING OR SHORTNESS OF BREATH, Disp: 8.5 Inhaler, Rfl: 5 .  valACYclovir (VALTREX) 1000 MG tablet, Take two tablets every 12 hours for one day as onset of cold sore symptoms, Disp: 16 tablet, Rfl: 1  Review of Systems  Constitutional: Negative.  Negative for chills.  Respiratory: Negative.   Cardiovascular: Negative.   Gastrointestinal: Negative.  Negative for abdominal pain, nausea and vomiting.  Genitourinary: Positive for flank pain, frequency, hematuria, hesitancy and urgency.    Social History   Tobacco Use  . Smoking status: Former Smoker    Last attempt to quit: 09/27/2013    Years since quitting: 4.8  . Smokeless tobacco: Never Used  Substance Use Topics  . Alcohol use: Yes    Alcohol/week: 0.0 standard drinks      Objective:   BP (!) 161/96 (BP Location: Left Arm, Patient Position:  Sitting, Cuff Size: Normal)   Pulse 82   Temp 98.2 F (36.8 C) (Oral)   Resp 16   LMP 07/23/2018 (Exact Date)  Vitals:   08/02/18 1528  BP: (!) 161/96  Pulse: 82  Resp: 16  Temp: 98.2 F (36.8 C)  TempSrc: Oral     Physical Exam Constitutional:      General: She is not in acute distress.    Appearance: Normal appearance. She is well-developed. She is obese. She is not ill-appearing or diaphoretic.  Cardiovascular:     Rate and Rhythm: Normal rate and regular rhythm.     Heart sounds: Normal heart sounds. No murmur. No friction rub. No gallop.   Pulmonary:     Effort: Pulmonary effort is normal. No respiratory distress.     Breath sounds: Normal breath sounds. No wheezing or rales.  Abdominal:     General: Bowel sounds are normal. There is no distension.     Palpations: Abdomen is soft. There is no mass.     Tenderness: There is no abdominal tenderness. There is no guarding or rebound.  Skin:    General: Skin is warm and dry.  Neurological:     Mental Status: She is alert and oriented to person, place, and time.        Assessment & Plan    1. Frequent urination Worsening symptoms. UA positive. Will treat empirically with Kelfex. Continue to push fluids. Urine sent for culture. Will  follow up pending C&S results. She is to call if symptoms do not improve or if they worsen.  - POCT urinalysis dipstick - Urine Culture  2. Acute cystitis without hematuria See above medical treatment plan. - cephALEXin (KEFLEX) 500 MG capsule; Take 1 capsule (500 mg total) by mouth 2 (two) times daily.  Dispense: 14 capsule; Refill: 0  3. Class 3 severe obesity due to excess calories with serious comorbidity and body mass index (BMI) of 40.0 to 44.9 in adult [facility]) Counseled patient on healthy lifestyle modifications including dieting and exercise.      Mar Daring, PA-C  [facility]

## 2018-08-02 NOTE — Patient Instructions (Signed)
Urinary Tract Infection, Adult A urinary tract infection (UTI) is an infection of any part of the urinary tract. The urinary tract includes:  The kidneys.  The ureters.  The bladder.  The urethra. These organs make, store, and get rid of pee (urine) in the body. What are the causes? This is caused by germs (bacteria) in your genital area. These germs grow and cause swelling (inflammation) of your urinary tract. What increases the risk? You are more likely to develop this condition if:  You have a small, thin tube (catheter) to drain pee.  You cannot control when you pee or poop (incontinence).  You are female, and: ? You use these methods to prevent pregnancy: ? A medicine that kills sperm (spermicide). ? A device that blocks sperm (diaphragm). ? You have low levels of a female hormone (estrogen). ? You are pregnant.  You have genes that add to your risk.  You are sexually active.  You take antibiotic medicines.  You have trouble peeing because of: ? A prostate that is bigger than normal, if you are female. ? A blockage in the part of your body that drains pee from the bladder (urethra). ? A kidney stone. ? A nerve condition that affects your bladder (neurogenic bladder). ? Not getting enough to drink. ? Not peeing often enough.  You have other conditions, such as: ? Diabetes. ? A weak disease-fighting system (immune system). ? Sickle cell disease. ? Gout. ? Injury of the spine. What are the signs or symptoms? Symptoms of this condition include:  Needing to pee right away (urgently).  Peeing often.  Peeing small amounts often.  Pain or burning when peeing.  Blood in the pee.  Pee that smells bad or not like normal.  Trouble peeing.  Pee that is cloudy.  Fluid coming from the vagina, if you are female.  Pain in the belly or lower back. Other symptoms include:  Throwing up (vomiting).  No urge to eat.  Feeling mixed up (confused).  Being tired  and grouchy (irritable).  A fever.  Watery poop (diarrhea). How is this treated? This condition may be treated with:  Antibiotic medicine.  Other medicines.  Drinking enough water. Follow these instructions at home:  Medicines  Take over-the-counter and prescription medicines only as told by your doctor.  If you were prescribed an antibiotic medicine, take it as told by your doctor. Do not stop taking it even if you start to feel better. General instructions  Make sure you: ? Pee until your bladder is empty. ? Do not hold pee for a long time. ? Empty your bladder after sex. ? Wipe from front to back after pooping if you are a female. Use each tissue one time when you wipe.  Drink enough fluid to keep your pee pale yellow.  Keep all follow-up visits as told by your doctor. This is important. Contact a doctor if:  You do not get better after 1-2 days.  Your symptoms go away and then come back. Get help right away if:  You have very bad back pain.  You have very bad pain in your lower belly.  You have a fever.  You are sick to your stomach (nauseous).  You are throwing up. Summary  A urinary tract infection (UTI) is an infection of any part of the urinary tract.  This condition is caused by germs in your genital area.  There are many risk factors for a UTI. These include having a small, thin   tube to drain pee and not being able to control when you pee or poop.  Treatment includes antibiotic medicines for germs.  Drink enough fluid to keep your pee pale yellow. This information is not intended to replace advice given to you by your health care provider. Make sure you discuss any questions you have with your health care provider. Document Released: 11/03/2007 Document Revised: 11/24/2017 Document Reviewed: 11/24/2017 Elsevier Interactive Patient Education  2019 Elsevier Inc.  

## 2018-08-03 DIAGNOSIS — F411 Generalized anxiety disorder: Secondary | ICD-10-CM | POA: Insufficient documentation

## 2018-08-04 ENCOUNTER — Telehealth: Payer: Self-pay | Admitting: Family Medicine

## 2018-08-04 DIAGNOSIS — B379 Candidiasis, unspecified: Secondary | ICD-10-CM

## 2018-08-04 DIAGNOSIS — T3695XA Adverse effect of unspecified systemic antibiotic, initial encounter: Principal | ICD-10-CM

## 2018-08-04 MED ORDER — FLUCONAZOLE 150 MG PO TABS: 150.0000 mg | ORAL_TABLET | Freq: Once | ORAL | 0 refills | Status: AC

## 2018-08-04 NOTE — Telephone Encounter (Signed)
Pt saw Tawanna Sat and started taking an antibiotic.  She now has a yeast infection. She is requesting the pill and not the cream.  The doesn't work for her with yeast infections. She is asking if the pill can be called in today.  Please call into: [id number] - [place], [place] - [address] [phone] (Phone) [phone] (Fax)   Thanks, [employer]

## 2018-08-04 NOTE — Telephone Encounter (Signed)
Patient advised as below.  

## 2018-08-04 NOTE — Telephone Encounter (Signed)
Diflucan sent in

## 2018-08-05 LAB — URINE CULTURE

## 2018-08-07 ENCOUNTER — Telehealth: Payer: Self-pay

## 2018-08-07 NOTE — Telephone Encounter (Signed)
Pt returned missed call.  Please call pt back. ° °Thanks, °[facility] °

## 2018-08-07 NOTE — Telephone Encounter (Signed)
[id number] 

## 2018-08-07 NOTE — Telephone Encounter (Signed)
Patient has been advised. KW 

## 2018-08-07 NOTE — Telephone Encounter (Signed)
-----   Message from Mar Daring, PA-C sent at 08/07/2018  8:06 AM EDT ----- Urine culture positive for e.coli. It is susceptible to Keflex (cephalexin). Continue until completed.

## 2018-12-28 ENCOUNTER — Other Ambulatory Visit: Payer: Self-pay | Admitting: Obstetrics and Gynecology

## 2018-12-28 DIAGNOSIS — Z1231 Encounter for screening mammogram for malignant neoplasm of breast: Secondary | ICD-10-CM

## 2019-01-05 LAB — HM PAP SMEAR: HM Pap smear: NEGATIVE

## 2019-01-25 ENCOUNTER — Ambulatory Visit
Admission: RE | Admit: 2019-01-25 | Discharge: 2019-01-25 | Disposition: A | Discharge status: Home / Self Care | Payer: Managed Care, Other (non HMO) | Source: Ambulatory Visit | Attending: Obstetrics and Gynecology | Admitting: Obstetrics and Gynecology

## 2019-01-25 DIAGNOSIS — Z1231 Encounter for screening mammogram for malignant neoplasm of breast: Secondary | ICD-10-CM | POA: Diagnosis not present

## 2019-03-05 ENCOUNTER — Other Ambulatory Visit: Payer: Self-pay | Admitting: Family Medicine

## 2019-03-05 DIAGNOSIS — B001 Herpesviral vesicular dermatitis: Secondary | ICD-10-CM

## 2019-03-05 MED ORDER — VALACYCLOVIR HCL 1 G PO TABS: ORAL_TABLET | ORAL | 0 refills | Status: DC

## 2019-03-05 NOTE — Telephone Encounter (Signed)
[facility] request for the following medications:   valACYclovir (VALTREX) 1000 MG tablet   Please advise.  

## 2019-06-08 ENCOUNTER — Ambulatory Visit (INDEPENDENT_AMBULATORY_CARE_PROVIDER_SITE_OTHER): Payer: Managed Care, Other (non HMO) | Admitting: Physician Assistant

## 2019-06-08 DIAGNOSIS — N760 Acute vaginitis: Secondary | ICD-10-CM

## 2019-06-08 DIAGNOSIS — B9689 Other specified bacterial agents as the cause of diseases classified elsewhere: Secondary | ICD-10-CM | POA: Diagnosis not present

## 2019-06-08 MED ORDER — METRONIDAZOLE 500 MG PO TABS: 500.0000 mg | ORAL_TABLET | Freq: Two times a day (BID) | ORAL | 0 refills | Status: AC

## 2019-06-08 NOTE — Progress Notes (Signed)
Patient: Hannah Roberson Female    DOB: [birth year]   49 y.o.   MRN: [id number] Visit Date: 06/08/2019  Today's Provider: Trinna Post, PA-C   Chief Complaint  Patient presents with  . Vaginal Discharge   Subjective:    I, Hannah Roberson,CMA am acting as a Education administrator for [employer]. Virtual Visit via Telephone Note  I connected with Hannah Roberson on 06/08/19 at  3:20 PM EST by telephone and verified that I am speaking with the correct person using two identifiers.  Location: Patient: Home Provider: Office   I discussed the limitations, risks, security and privacy concerns of performing an evaluation and management service by telephone and the availability of in person appointments. I also discussed with the patient that there may be a patient responsible charge related to this service. The patient expressed understanding and agreed to proceed.  Vaginal Discharge The patient's primary symptoms include a genital odor and vaginal discharge. This is a new problem. The current episode started in the past 7 days. The problem occurs constantly. The problem has been unchanged. The patient is experiencing no pain. Pertinent negatives include no abdominal pain, back pain, discolored urine or dysuria. The vaginal discharge was brown and thin. There has been no bleeding. She has not been passing clots. She has not been passing tissue. She has tried nothing for the symptoms. The treatment provided no relief. Menstrual history: Has not had a menstrual cycle in 2 months per pt.   Reports she has a history of bacterial vaginitis and is having some similar symptoms.   No Known Allergies   Current Outpatient Medications:  .  esomeprazole (NEXIUM) 40 MG capsule, Take 40 mg by mouth daily at 12 noon., Disp: , Rfl:  .  PROAIR HFA 108 (90 Base) MCG/ACT inhaler, INHALE 2 PUFFS INTO LUNGS EVERY 6 HOURS AS NEEDED FOR WHEEZING OR SHORTNESS OF BREATH, Disp: 8.5 Inhaler, Rfl: 5 .   valACYclovir (VALTREX) 1000 MG tablet, Take two tablets every 12 hours for one day as onset of cold sore symptoms. Please schedule office visit before any future refills, Disp: 16 tablet, Rfl: 0 .  cephALEXin (KEFLEX) 500 MG capsule, Take 1 capsule (500 mg total) by mouth 2 (two) times daily. (Patient not taking: Reported on 06/08/2019), Disp: 14 capsule, Rfl: 0  Review of Systems  Constitutional: Negative.   Gastrointestinal: Negative for abdominal pain.  Genitourinary: Positive for vaginal discharge. Negative for dysuria.  Musculoskeletal: Negative for back pain.    Social History   Tobacco Use  . Smoking status: Former Smoker    Quit date: 09/27/2013    Years since quitting: 5.6  . Smokeless tobacco: Never Used  Substance Use Topics  . Alcohol use: Yes    Alcohol/week: 0.0 standard drinks      Objective:   There were no vitals taken for this visit. There were no vitals filed for this visit.There is no height or weight on file to calculate BMI.   Physical Exam   No results found for any visits on 06/08/19.     Assessment & Plan    1. Bacterial vaginitis  History of BV documented at [facility] in [place], patient feels this episode is similar. Fill as below.   - metroNIDAZOLE (FLAGYL) 500 MG tablet; Take 1 tablet (500 mg total) by mouth 2 (two) times daily for 7 days.  Dispense: 14 tablet; Refill: 0  I discussed the assessment and treatment plan with the  patient. The patient was provided an opportunity to ask questions and all were answered. The patient agreed with the plan and demonstrated an understanding of the instructions.   The patient was advised to call back or seek an in-person evaluation if the symptoms worsen or if the condition fails to improve as anticipated.  I provided 15 minutes of non-face-to-face time during this encounter.     Trinna Post, PA-C  [facility]

## 2019-06-11 NOTE — Progress Notes (Signed)
Patient pap was abstracted to her chart.

## 2019-07-10 ENCOUNTER — Other Ambulatory Visit: Payer: Self-pay | Admitting: Family Medicine

## 2019-07-10 DIAGNOSIS — J4520 Mild intermittent asthma, uncomplicated: Secondary | ICD-10-CM

## 2019-07-10 NOTE — Telephone Encounter (Signed)
L.O.V. was on 06/08/2019 and no upcoming appointment.Please advise

## 2019-07-10 NOTE — Telephone Encounter (Signed)
[facility] for the following medications:  PROAIR HFA 108 (90 Base) MCG/ACT inhaler  Please advise.  Thanks, [employer]

## 2019-07-11 MED ORDER — ALBUTEROL SULFATE HFA 108 (90 BASE) MCG/ACT IN AERS: 2.0000 | INHALATION_SPRAY | Freq: Four times a day (QID) | RESPIRATORY_TRACT | 0 refills | Status: DC | PRN

## 2019-07-11 NOTE — Telephone Encounter (Signed)
Patient still has not received her script for [employer] inhaler.  Please advise.

## 2019-07-11 NOTE — Telephone Encounter (Signed)
Patient scheduled appointment on 07/25/2019 @ 4:00PM.

## 2019-07-11 NOTE — Telephone Encounter (Signed)
This medicine has not been filled since 2019. I filled it for one inhaler. I have not evaluated patient for this issue and she should come in and re-establish with new provider as her previous one left if she is needing more refills. Also to address blood pressure which as been consistently elevated on previous visits.

## 2019-07-25 ENCOUNTER — Other Ambulatory Visit: Payer: Self-pay

## 2019-07-25 ENCOUNTER — Encounter: Payer: Self-pay | Admitting: Physician Assistant

## 2019-07-25 ENCOUNTER — Ambulatory Visit: Payer: Managed Care, Other (non HMO) | Admitting: Physician Assistant

## 2019-07-25 VITALS — BP 155/84 | HR 94 | Temp 96.6°F | Wt 244.4 lb

## 2019-07-25 DIAGNOSIS — I10 Essential (primary) hypertension: Secondary | ICD-10-CM

## 2019-07-25 NOTE — Progress Notes (Signed)
Patient: Hannah Roberson Female    DOB: 1971-02-26   49 y.o.   MRN: [id number] Visit Date: 07/25/2019  Today's Provider: Trinna Post, PA-C   Chief Complaint  Patient presents with  . Asthma   Subjective:    I, Porsha McClurkin,CMA am acting as a scribe for [employer].  HPI  Asthma Patient presents today for asthma follow-up. Patient last office visit was on 07/09/2018. Patient states that she does not have to use the inhaler as often due to stop smoking 6 years ago.  Patient states that she was told to come in for office visit blood pressure.  High Blood Pressure: Patient reports she has never had a history of high blood pressure previously. She has gained more weight recently.  BP Readings from Last 8 Encounters:  07/25/19 (!) 155/84  08/02/18 (!) 161/96  12/07/17 (!) 120/92  07/12/16 108/84  06/05/15 139/83  05/19/15 112/82  04/23/14 122/84   Wt Readings from Last 3 Encounters:  07/25/19 244 lb 6.4 oz (110.9 kg)  06/05/15 230 lb (104.3 kg)  05/19/15 239 lb 12.8 oz (108.8 kg)   BMI Readings from Last 8 Encounters:  07/25/19 44.70 kg/m  06/05/15 42.07 kg/m      No Known Allergies   Current Outpatient Medications:  .  albuterol (PROAIR HFA) 108 (90 Base) MCG/ACT inhaler, Inhale 2 puffs into the lungs every 6 (six) hours as needed for wheezing or shortness of breath., Disp: 6.7 g, Rfl: 0 .  esomeprazole (NEXIUM) 40 MG capsule, Take 40 mg by mouth daily at 12 noon., Disp: , Rfl:  .  valACYclovir (VALTREX) 1000 MG tablet, Take two tablets every 12 hours for one day as onset of cold sore symptoms. Please schedule office visit before any future refills, Disp: 16 tablet, Rfl: 0 .  cephALEXin (KEFLEX) 500 MG capsule, Take 1 capsule (500 mg total) by mouth 2 (two) times daily. (Patient not taking: Reported on 06/08/2019), Disp: 14 capsule, Rfl: 0  Review of Systems  Social History   Tobacco Use  . Smoking status: Former Smoker    Quit date:  09/27/2013    Years since quitting: 5.8  . Smokeless tobacco: Never Used  Substance Use Topics  . Alcohol use: Yes    Alcohol/week: 0.0 standard drinks      Objective:   BP (!) 155/84 (BP Location: Left Arm, Patient Position: Sitting, Cuff Size: Large)   Pulse 94   Temp (!) 96.6 F (35.9 C) (Temporal)   Wt 244 lb 6.4 oz (110.9 kg)   LMP 07/22/2019 (Exact Date)   BMI 44.70 kg/m  Vitals:   07/25/19 1612  BP: (!) 155/84  Pulse: 94  Temp: (!) 96.6 F (35.9 C)  TempSrc: Temporal  Weight: 244 lb 6.4 oz (110.9 kg)  Body mass index is 44.7 kg/m.   Physical Exam Constitutional:      Appearance: Normal appearance.  Cardiovascular:     Rate and Rhythm: Normal rate and regular rhythm.     Heart sounds: Normal heart sounds.  Pulmonary:     Effort: Pulmonary effort is normal.     Breath sounds: Normal breath sounds.  Skin:    General: Skin is warm and dry.  Neurological:     Mental Status: She is alert and oriented to person, place, and time. Mental status is at baseline.  Psychiatric:        Mood and Affect: Mood normal.  Behavior: Behavior normal.      No results found for any visits on 07/25/19.     Assessment & Plan    1. Essential hypertension  We have had an in depth discussion about high blood pressure, its causes and treatments. We have discussed lifestyle interventions to reduce blood pressure including reduced sodium, calorie deficit to lost weight and increasing exercise. We will follow up in three months and if BP remains elevated at that point, I would recommend starting a BP medication.   - Comprehensive Metabolic Panel (CMET) - HgB A1c - Lipid Profile - CBC with Differential  2. Morbid obesity ([place])  The entirety of the information documented in the History of Present Illness, Review of Systems and Physical Exam were personally obtained by me. Portions of this information were initially documented by [facility] and reviewed by me for  thoroughness and accuracy.        Trinna Post, PA-C  [facility]

## 2019-07-26 NOTE — Patient Instructions (Signed)

## 2019-10-03 ENCOUNTER — Other Ambulatory Visit: Payer: Self-pay | Admitting: Physician Assistant

## 2019-10-03 DIAGNOSIS — B001 Herpesviral vesicular dermatitis: Secondary | ICD-10-CM

## 2019-10-03 MED ORDER — VALACYCLOVIR HCL 1 G PO TABS: ORAL_TABLET | ORAL | 0 refills | Status: DC

## 2019-10-03 NOTE — Telephone Encounter (Signed)
Medication Refill - Medication: valACYclovir (VALTREX) 1000 MG tablet ID:[id number] The pill, pt stated that she has a huge event coming up on Saturday and feel like a fever blister is coming on.  She would like this med asap  Has the patient contacted their pharmacy? No. (Agent: If no, request that the patient contact the pharmacy for the refill.) (Agent: If yes, when and what did the pharmacy advise?)  Preferred Pharm[facility] (with phone number [facility] street [facility] [address]   Agent: Please be advised that RX refills may take up to 3 business days. We ask that you follow-up with your pharmacy.

## 2019-10-22 ENCOUNTER — Other Ambulatory Visit: Payer: Self-pay

## 2019-10-22 ENCOUNTER — Ambulatory Visit: Payer: Managed Care, Other (non HMO) | Admitting: Physician Assistant

## 2019-10-22 ENCOUNTER — Encounter: Payer: Self-pay | Admitting: Physician Assistant

## 2019-10-22 VITALS — BP 138/108 | HR 86 | Temp 96.2°F | Resp 16 | Wt 232.8 lb

## 2019-10-22 DIAGNOSIS — I10 Essential (primary) hypertension: Secondary | ICD-10-CM | POA: Diagnosis not present

## 2019-10-22 DIAGNOSIS — N921 Excessive and frequent menstruation with irregular cycle: Secondary | ICD-10-CM | POA: Insufficient documentation

## 2019-10-22 MED ORDER — LOSARTAN POTASSIUM 50 MG PO TABS: 50.0000 mg | ORAL_TABLET | Freq: Every day | ORAL | 0 refills | Status: DC

## 2019-10-22 MED ORDER — LOSARTAN POTASSIUM-HCTZ 50-12.5 MG PO TABS: 1.0000 | ORAL_TABLET | Freq: Every day | ORAL | 0 refills | Status: DC

## 2019-10-22 MED ORDER — HYDROCHLOROTHIAZIDE 12.5 MG PO CAPS: 12.5000 mg | ORAL_CAPSULE | Freq: Every day | ORAL | 0 refills | Status: DC

## 2019-10-22 NOTE — Progress Notes (Signed)
Established patient visit   Patient: Hannah Roberson   DOB: [birth year]   49 y.o. Female  MRN: [id number] Visit Date: 10/22/2019  Today's healthcare provider: Trinna Post, PA-C   Fritzi Mandes Wolford,acting as a scribe for Trinna Post, PA-C.,have documented all relevant documentation on the behalf of Trinna Post, PA-C,as directed by  Trinna Post, PA-C while in the presence of Trinna Post, PA-C. Chief Complaint  Patient presents with  . Hypertension   Subjective    HPI Hypertension, follow-up  BP Readings from Last 3 Encounters:  10/22/19 (!) 138/108  07/25/19 (!) 155/84  08/02/18 (!) 161/96   Wt Readings from Last 3 Encounters:  10/22/19 232 lb 12.8 oz (105.6 kg)  07/25/19 244 lb 6.4 oz (110.9 kg)  06/05/15 230 lb (104.3 kg)     She was last seen for hypertension 3 months ago.  BP at that visit was 155/84. Management since that visit includes no changes or medication at this moment. She is following a Regular diet. She is not exercising.Patient walks 6k steps a day She does not smoke.   Use of agents associated with hypertension: none.   Outside blood pressures are not being checked. Symptoms: No chest pain No chest pressure  No palpitations No syncope  No dyspnea No orthopnea  No paroxysmal nocturnal dyspnea No lower extremity edema   Pertinent labs: No results found for: CHOL, HDL, LDLCALC, LDLDIRECT, TRIG, CHOLHDL Lab Results  Component Value Date   NA 137 06/05/2015   K 3.8 06/05/2015   CREATININE 0.54 06/05/2015   GFRNONAA >60 06/05/2015   GFRAA >60 06/05/2015   GLUCOSE 119 (H) 06/05/2015     The ASCVD Risk score Mikey Bussing DC Jr., et al., 2013) failed to calculate for the following reasons:   Cannot find a previous HDL lab   Cannot find a previous total cholesterol lab   ---------------------------------------------------------------------------------------------------   Patient Active Problem List   Diagnosis Date Noted    . Menorrhagia with regular cycle 10/22/2019  . GAD (generalized anxiety disorder) 08/03/2018  . Overactive bladder 09/27/2014  . Obesity due to excess calories, unspecified obesity severity 09/27/2014  . Nocturia 09/27/2014  . Increased frequency of urination 09/27/2014  . Allergic rhinitis 09/23/2006  . Asthma, exogenous 09/23/2006  . Esophagitis, reflux 09/23/2006   Past Medical History:  Diagnosis Date  . Allergic rhinitis   . Asthma, extrinsic   . Esophagitis, reflux    Allergies  Allergen Reactions  . Metronidazole Hives and Shortness Of Breath    Other reaction(s): Angioedema Has previously used without reaction before. New onset allergy 06/2019. Given benadryl and inhaler       Medications: Outpatient Medications Prior to Visit  Medication Sig  . albuterol (PROAIR HFA) 108 (90 Base) MCG/ACT inhaler Inhale 2 puffs into the lungs every 6 (six) hours as needed for wheezing or shortness of breath.  . valACYclovir (VALTREX) 1000 MG tablet Take two tablets every 12 hours for one day as onset of cold sore symptoms.  . [DISCONTINUED] cephALEXin (KEFLEX) 500 MG capsule Take 1 capsule (500 mg total) by mouth 2 (two) times daily. (Patient not taking: Reported on 06/08/2019)  . [DISCONTINUED] esomeprazole (NEXIUM) 40 MG capsule Take 40 mg by mouth daily at 12 noon.   No facility-administered medications prior to visit.    Review of Systems  Constitutional: Negative.   Respiratory: Negative.   Cardiovascular: Negative.   Musculoskeletal: Negative.   Hematological: Negative.  Last metabolic panel Lab Results  Component Value Date   GLUCOSE 119 (H) 06/05/2015   NA 137 06/05/2015   K 3.8 06/05/2015   CL 103 06/05/2015   CO2 25 06/05/2015   BUN 6 06/05/2015   CREATININE 0.54 06/05/2015   GFRNONAA >60 06/05/2015   GFRAA >60 06/05/2015   CALCIUM 9.0 06/05/2015   PROT 7.8 06/05/2015   ALBUMIN 4.0 06/05/2015   BILITOT 1.0 06/05/2015   ALKPHOS 121 06/05/2015   AST 39  06/05/2015   ALT 57 (H) 06/05/2015   ANIONGAP 9 06/05/2015   Last thyroid functions No results found for: TSH, T3TOTAL, T4TOTAL, THYROIDAB    Objective    BP (!) 138/108   Pulse 86   Temp (!) 96.2 F (35.7 C) (Oral)   Resp 16   Wt 232 lb 12.8 oz (105.6 kg)   LMP 09/14/2019 (Exact Date)   SpO2 98%   BMI 42.58 kg/m  BP Readings from Last 3 Encounters:  10/22/19 (!) 138/108  07/25/19 (!) 155/84  08/02/18 (!) 161/96      Physical Exam Constitutional:      Appearance: Normal appearance.  Cardiovascular:     Rate and Rhythm: Normal rate and regular rhythm.     Heart sounds: Normal heart sounds.  Pulmonary:     Effort: Pulmonary effort is normal.     Breath sounds: Normal breath sounds.  Skin:    General: Skin is warm and dry.  Neurological:     Mental Status: She is alert and oriented to person, place, and time. Mental status is at baseline.  Psychiatric:        Mood and Affect: Mood normal.        Behavior: Behavior normal.       No results found for any visits on 10/22/19.  Assessment & Plan     1. Essential hypertension  uncontrolled- start medication as below Recheck baseline metabolic panel and again one month at follow up F/u in 1 months  - losartan-hydrochlorothiazide (HYZAAR) 50-12.5 MG tablet; Take 1 tablet by mouth daily.  Dispense: 90 tablet; Refill: 0  Return in about 4 weeks (around 11/19/2019) for Hypertension.      ITrinna Post, PA-C, have reviewed all documentation for this visit. The documentation on 10/22/19 for the exam, diagnosis, procedures, and orders are all accurate and complete.    Paulene Floor  [facility] [phone] (phone) [phone] (fax)  [place]

## 2019-10-22 NOTE — Patient Instructions (Signed)
   Managing Your Hypertension Hypertension is commonly called high blood pressure. This is when the force of your blood pressing against the walls of your arteries is too strong. Arteries are blood vessels that carry blood from your heart throughout your body. Hypertension forces the heart to work harder to pump blood, and may cause the arteries to become narrow or stiff. Having untreated or uncontrolled hypertension can cause heart attack, stroke, kidney disease, and other problems. What are blood pressure readings? A blood pressure reading consists of a higher number over a lower number. Ideally, your blood pressure should be below 120/80. The first ("top") number is called the systolic pressure. It is a measure of the pressure in your arteries as your heart beats. The second ("bottom") number is called the diastolic pressure. It is a measure of the pressure in your arteries as the heart relaxes. What does my blood pressure reading mean? Blood pressure is classified into four stages. Based on your blood pressure reading, your health care provider may use the following stages to determine what type of treatment you need, if any. Systolic pressure and diastolic pressure are measured in a unit called mm Hg. Normal  Systolic pressure: below 120.  Diastolic pressure: below 80. Elevated  Systolic pressure: 120-129.  Diastolic pressure: below 80. Hypertension stage 1  Systolic pressure: 130-139.  Diastolic pressure: 80-89. Hypertension stage 2  Systolic pressure: 140 or above.  Diastolic pressure: 90 or above. What health risks are associated with hypertension? Managing your hypertension is an important responsibility. Uncontrolled hypertension can lead to:  A heart attack.  A stroke.  A weakened blood vessel (aneurysm).  Heart failure.  Kidney damage.  Eye damage.  Metabolic syndrome.  Memory and concentration problems. What changes can I make to manage my  hypertension? Hypertension can be managed by making lifestyle changes and possibly by taking medicines. Your health care provider will help you make a plan to bring your blood pressure within a normal range. Eating and drinking   Eat a diet that is high in fiber and potassium, and low in salt (sodium), added sugar, and fat. An example eating plan is called the DASH (Dietary Approaches to Stop Hypertension) diet. To eat this way: ? Eat plenty of fresh fruits and vegetables. Try to fill half of your plate at each meal with fruits and vegetables. ? Eat whole grains, such as whole wheat pasta, brown rice, or whole grain bread. Fill about one quarter of your plate with whole grains. ? Eat low-fat diary products. ? Avoid fatty cuts of meat, processed or cured meats, and poultry with skin. Fill about one quarter of your plate with lean proteins such as fish, chicken without skin, beans, eggs, and tofu. ? Avoid premade and processed foods. These tend to be higher in sodium, added sugar, and fat.  Reduce your daily sodium intake. Most people with hypertension should eat less than 1,500 mg of sodium a day.  Limit alcohol intake to no more than 1 drink a day for nonpregnant women and 2 drinks a day for men. One drink equals 12 oz of beer, 5 oz of wine, or 1 oz of hard liquor. Lifestyle  Work with your health care provider to maintain a healthy body weight, or to lose weight. Ask what an ideal weight is for you.  Get at least 30 minutes of exercise that causes your heart to beat faster (aerobic exercise) most days of the week. Activities may include walking, swimming, or biking.    Include exercise to strengthen your muscles (resistance exercise), such as weight lifting, as part of your weekly exercise routine. Try to do these types of exercises for 30 minutes at least 3 days a week.  Do not use any products that contain nicotine or tobacco, such as cigarettes and e-cigarettes. If you need help quitting,  ask your health care provider.  Control any long-term (chronic) conditions you have, such as high cholesterol or diabetes. Monitoring  Monitor your blood pressure at home as told by your health care provider. Your personal target blood pressure may vary depending on your medical conditions, your age, and other factors.  Have your blood pressure checked regularly, as often as told by your health care provider. Working with your health care provider  Review all the medicines you take with your health care provider because there may be side effects or interactions.  Talk with your health care provider about your diet, exercise habits, and other lifestyle factors that may be contributing to hypertension.  Visit your health care provider regularly. Your health care provider can help you create and adjust your plan for managing hypertension. Will I need medicine to control my blood pressure? Your health care provider may prescribe medicine if lifestyle changes are not enough to get your blood pressure under control, and if:  Your systolic blood pressure is 130 or higher.  Your diastolic blood pressure is 80 or higher. Take medicines only as told by your health care provider. Follow the directions carefully. Blood pressure medicines must be taken as prescribed. The medicine does not work as well when you skip doses. Skipping doses also puts you at risk for problems. Contact a health care provider if:  You think you are having a reaction to medicines you have taken.  You have repeated (recurrent) headaches.  You feel dizzy.  You have swelling in your ankles.  You have trouble with your vision. Get help right away if:  You develop a severe headache or confusion.  You have unusual weakness or numbness, or you feel faint.  You have severe pain in your chest or abdomen.  You vomit repeatedly.  You have trouble breathing. Summary  Hypertension is when the force of blood pumping  through your arteries is too strong. If this condition is not controlled, it may put you at risk for serious complications.  Your personal target blood pressure may vary depending on your medical conditions, your age, and other factors. For most people, a normal blood pressure is less than 120/80.  Hypertension is managed by lifestyle changes, medicines, or both. Lifestyle changes include weight loss, eating a healthy, low-sodium diet, exercising more, and limiting alcohol. This information is not intended to replace advice given to you by your health care provider. Make sure you discuss any questions you have with your health care provider. Document Revised: 09/08/2018 Document Reviewed: 04/14/2016 Elsevier Patient Education  2020 Elsevier Inc.  

## 2019-10-23 ENCOUNTER — Telehealth: Payer: Self-pay

## 2019-10-23 NOTE — Telephone Encounter (Signed)
Copied from [place] [id number]. Topic: General - Other >> Oct 23, 2019  4:48 PM Wynetta Emery, [place] C wrote: Reason for CRM: pt called in to ask if she could switch to Dr. B instead. Pt says that she feels that she has seen provider before and felt more comfortable with her. Will Dr. B take pt on as np?   [id number] - please assist

## 2019-10-24 NOTE — Telephone Encounter (Signed)
LMTCB 10/24/2019.   Pt's appointment has been changed to Dr. Brita Romp 12/12/2019 at [place] please advise pt when she calls back.   Thanks,   -Mickel Baas

## 2019-10-24 NOTE — Telephone Encounter (Signed)
I don't think I've ever seen her as a patient before, but that is fine.

## 2019-10-24 NOTE — Telephone Encounter (Signed)
Yes please cancel her 12/12/2019 appointment and reschedule her.

## 2019-12-12 ENCOUNTER — Ambulatory Visit: Payer: Self-pay | Admitting: Physician Assistant

## 2019-12-12 ENCOUNTER — Ambulatory Visit: Payer: Self-pay | Admitting: Family Medicine

## 2019-12-24 ENCOUNTER — Other Ambulatory Visit (HOSPITAL_COMMUNITY)
Admission: RE | Admit: 2019-12-24 | Discharge: 2019-12-24 | Disposition: A | Discharge status: Home / Self Care | Payer: Managed Care, Other (non HMO) | Source: Ambulatory Visit | Attending: Physician Assistant | Admitting: Physician Assistant

## 2019-12-24 ENCOUNTER — Other Ambulatory Visit: Payer: Self-pay

## 2019-12-24 ENCOUNTER — Ambulatory Visit: Payer: Managed Care, Other (non HMO) | Admitting: Physician Assistant

## 2019-12-24 ENCOUNTER — Encounter: Payer: Self-pay | Admitting: Physician Assistant

## 2019-12-24 VITALS — BP 111/71 | HR 153 | Temp 97.1°F | Resp 16 | Ht 62.0 in

## 2019-12-24 DIAGNOSIS — N760 Acute vaginitis: Secondary | ICD-10-CM | POA: Insufficient documentation

## 2019-12-24 DIAGNOSIS — B9689 Other specified bacterial agents as the cause of diseases classified elsewhere: Secondary | ICD-10-CM | POA: Insufficient documentation

## 2019-12-24 DIAGNOSIS — R3989 Other symptoms and signs involving the genitourinary system: Secondary | ICD-10-CM

## 2019-12-24 DIAGNOSIS — R0981 Nasal congestion: Secondary | ICD-10-CM | POA: Diagnosis not present

## 2019-12-24 LAB — POCT URINALYSIS DIPSTICK
Bilirubin, UA: NEGATIVE
Blood, UA: NEGATIVE
Glucose, UA: NEGATIVE
Ketones, UA: NEGATIVE
Leukocytes, UA: NEGATIVE
Nitrite, UA: NEGATIVE
Protein, UA: NEGATIVE
Spec Grav, UA: 1.015 (ref 1.010–1.025)
Urobilinogen, UA: 0.2 E.U./dL
pH, UA: 6 (ref 5.0–8.0)

## 2019-12-24 MED ORDER — CLINDAMYCIN PHOSPHATE 2 % VA CREA: 1.0000 | TOPICAL_CREAM | Freq: Every day | VAGINAL | 0 refills | Status: DC

## 2019-12-24 MED ORDER — METRONIDAZOLE 0.75 % VA GEL: 1.0000 | Freq: Two times a day (BID) | VAGINAL | 0 refills | Status: DC

## 2019-12-24 NOTE — Progress Notes (Signed)
Established patient visit   Patient: Hannah Roberson   DOB: [birth year]   49 y.o. Female  MRN: [id number] Visit Date: 12/24/2019  Today's healthcare provider: Mar Daring, PA-C   Chief Complaint  Patient presents with  . Dysuria   Subjective    Dysuria  This is a new problem. The current episode started in the past 7 days. The problem has been unchanged. There has been no fever. Associated symptoms include a discharge and urgency. Pertinent negatives include no frequency, hematuria or vomiting. She has tried nothing for the symptoms.     Patient Active Problem List   Diagnosis Date Noted  . Menorrhagia with regular cycle 10/22/2019  . GAD (generalized anxiety disorder) 08/03/2018  . Overactive bladder 09/27/2014  . Obesity due to excess calories, unspecified obesity severity 09/27/2014  . Nocturia 09/27/2014  . Increased frequency of urination 09/27/2014  . Allergic rhinitis 09/23/2006  . Asthma, exogenous 09/23/2006  . Esophagitis, reflux 09/23/2006   Past Medical History:  Diagnosis Date  . Allergic rhinitis   . Asthma, extrinsic   . Esophagitis, reflux        Medications: Outpatient Medications Prior to Visit  Medication Sig  . losartan-hydrochlorothiazide (HYZAAR) 50-12.5 MG tablet Take 1 tablet by mouth daily.  . valACYclovir (VALTREX) 1000 MG tablet Take two tablets every 12 hours for one day as onset of cold sore symptoms.  Marland Kitchen albuterol (PROAIR HFA) 108 (90 Base) MCG/ACT inhaler Inhale 2 puffs into the lungs every 6 (six) hours as needed for wheezing or shortness of breath.   No facility-administered medications prior to visit.    Review of Systems  Constitutional: Negative.   HENT: Positive for congestion, rhinorrhea and sinus pressure.   Respiratory: Negative.   Cardiovascular: Negative.   Gastrointestinal: Negative for vomiting.  Genitourinary: Positive for dysuria and urgency. Negative for frequency and hematuria.    Last CBC Lab  Results  Component Value Date   WBC 18.1 (H) 06/05/2015   HGB 11.7 (L) 06/05/2015   HCT 36.4 06/05/2015   MCV 79.4 (L) 06/05/2015   MCH 25.5 (L) 06/05/2015   RDW 15.3 (H) 06/05/2015   PLT 310 32/35/5732   Last metabolic panel Lab Results  Component Value Date   GLUCOSE 119 (H) 06/05/2015   NA 137 06/05/2015   K 3.8 06/05/2015   CL 103 06/05/2015   CO2 25 06/05/2015   BUN 6 06/05/2015   CREATININE 0.54 06/05/2015   GFRNONAA >60 06/05/2015   GFRAA >60 06/05/2015   CALCIUM 9.0 06/05/2015   PROT 7.8 06/05/2015   ALBUMIN 4.0 06/05/2015   BILITOT 1.0 06/05/2015   ALKPHOS 121 06/05/2015   AST 39 06/05/2015   ALT 57 (H) 06/05/2015   ANIONGAP 9 06/05/2015      Objective    BP 111/71 (BP Location: Left Arm, Patient Position: Sitting, Cuff Size: Large)   Pulse (!) 153 Comment: 110 manually  Temp (!) 97.1 F (36.2 C) (Temporal)   Resp 16   Ht 5\' 2"  (1.575 m)   BMI 42.58 kg/m  BP Readings from Last 3 Encounters:  12/24/19 111/71  10/22/19 (!) 138/108  07/25/19 (!) 155/84   Wt Readings from Last 3 Encounters:  10/22/19 232 lb 12.8 oz (105.6 kg)  07/25/19 244 lb 6.4 oz (110.9 kg)  06/05/15 230 lb (104.3 kg)      Physical Exam Vitals reviewed.  Constitutional:      General: She is not in acute distress.  Appearance: Normal appearance. She is well-developed. She is obese. She is not ill-appearing or diaphoretic.  HENT:     Head: Normocephalic and atraumatic.     Right Ear: Hearing, tympanic membrane, ear canal and external ear normal.     Left Ear: Hearing, tympanic membrane, ear canal and external ear normal.     Mouth/Throat:     Pharynx: Uvula midline.  Eyes:     General: No scleral icterus.       Right eye: No discharge.        Left eye: No discharge.     Extraocular Movements: Extraocular movements intact.     Conjunctiva/sclera: Conjunctivae normal.     Pupils: Pupils are equal, round, and reactive to light.  Neck:     Thyroid: No thyromegaly.      Trachea: No tracheal deviation.  Cardiovascular:     Rate and Rhythm: Normal rate and regular rhythm.     Heart sounds: Normal heart sounds. No murmur heard.  No friction rub. No gallop.   Pulmonary:     Effort: Pulmonary effort is normal. No respiratory distress.     Breath sounds: Normal breath sounds. No stridor. No wheezing or rales.  Abdominal:     General: Abdomen is flat. Bowel sounds are normal.     Palpations: Abdomen is soft.     Tenderness: There is no abdominal tenderness.  Musculoskeletal:     Cervical back: Normal range of motion and neck supple. No tenderness.  Lymphadenopathy:     Cervical: No cervical adenopathy.  Skin:    General: Skin is warm and dry.  Neurological:     Mental Status: She is alert.       Results for orders placed or performed in visit on 12/24/19  POCT urinalysis dipstick  Result Value Ref Range   Color, UA Clear    Clarity, UA Light Yellow    Glucose, UA Negative Negative   Bilirubin, UA Negative    Ketones, UA Negative    Spec Grav, UA 1.015 1.010 - 1.025   Blood, UA Negative    pH, UA 6.0 5.0 - 8.0   Protein, UA Negative Negative   Urobilinogen, UA 0.2 0.2 or 1.0 E.U./dL   Nitrite, UA Negative    Leukocytes, UA Negative Negative   Appearance     Odor      Assessment & Plan     1. Possible urinary tract infection UA normal today. Suspect BV.   2. BV (bacterial vaginosis) Will treat with clindamycin vaginal cream as below. Vaginal swab self collected by patient today. Will f/u pending results. Call if symptoms worsen in the meantime. - Cervicovaginal ancillary only - clindamycin (CLEOCIN) 2 % vaginal cream; Place 1 Applicatorful vaginally at bedtime.  Dispense: 40 g; Refill: 0  3. Nasal congestion I suspect most likely allergies as she does have history. Patient reports trying loratadine but it caused worsening congestion/difficulty breathing in nose. Symptoms have been present since January patient states but progressively  worsening. She feels it may be allergic reaction to losartan-hctz. She wants to discuss with Dr. B at her next appointment.   No follow-ups on file.      Reynolds Bowl, PA-C, have reviewed all documentation for this visit. The documentation on 12/24/19 for the exam, diagnosis, procedures, and orders are all accurate and complete.   Rubye Beach  [facility] [phone] (phone) [phone] (fax)  [place]

## 2019-12-24 NOTE — Patient Instructions (Signed)
Bacterial Vaginosis  Bacterial vaginosis is a vaginal infection that occurs when the normal balance of bacteria in the vagina is disrupted. It results from an overgrowth of certain bacteria. This is the most common vaginal infection among women ages 15-44. Because bacterial vaginosis increases your risk for STIs (sexually transmitted infections), getting treated can help reduce your risk for chlamydia, gonorrhea, herpes, and HIV (human immunodeficiency virus). Treatment is also important for preventing complications in pregnant women, because this condition can cause an early (premature) delivery. What are the causes? This condition is caused by an increase in harmful bacteria that are normally present in small amounts in the vagina. However, the reason that the condition develops is not fully understood. What increases the risk? The following factors may make you more likely to develop this condition:  Having a new sexual partner or multiple sexual partners.  Having unprotected sex.  Douching.  Having an intrauterine device (IUD).  Smoking.  Drug and alcohol abuse.  Taking certain antibiotic medicines.  Being pregnant. You cannot get bacterial vaginosis from toilet seats, bedding, swimming pools, or contact with objects around you. What are the signs or symptoms? Symptoms of this condition include:  Grey or white vaginal discharge. The discharge can also be watery or foamy.  A fish-like odor with discharge, especially after sexual intercourse or during menstruation.  Itching in and around the vagina.  Burning or pain with urination. Some women with bacterial vaginosis have no signs or symptoms. How is this diagnosed? This condition is diagnosed based on:  Your medical history.  A physical exam of the vagina.  Testing a sample of vaginal fluid under a microscope to look for a large amount of bad bacteria or abnormal cells. Your health care provider may use a cotton swab or  a small wooden spatula to collect the sample. How is this treated? This condition is treated with antibiotics. These may be given as a pill, a vaginal cream, or a medicine that is put into the vagina (suppository). If the condition comes back after treatment, a second round of antibiotics may be needed. Follow these instructions at home: Medicines  Take over-the-counter and prescription medicines only as told by your health care provider.  Take or use your antibiotic as told by your health care provider. Do not stop taking or using the antibiotic even if you start to feel better. General instructions  If you have a female sexual partner, tell her that you have a vaginal infection. She should see her health care provider and be treated if she has symptoms. If you have a female sexual partner, he does not need treatment.  During treatment: ? Avoid sexual activity until you finish treatment. ? Do not douche. ? Avoid alcohol as directed by your health care provider. ? Avoid breastfeeding as directed by your health care provider.  Drink enough water and fluids to keep your urine clear or pale yellow.  Keep the area around your vagina and rectum clean. ? Wash the area daily with warm water. ? Wipe yourself from front to back after using the toilet.  Keep all follow-up visits as told by your health care provider. This is important. How is this prevented?  Do not douche.  Wash the outside of your vagina with warm water only.  Use protection when having sex. This includes latex condoms and dental dams.  Limit how many sexual partners you have. To help prevent bacterial vaginosis, it is best to have sex with just one partner (  monogamous).  Make sure you and your sexual partner are tested for STIs.  Wear cotton or cotton-lined underwear.  Avoid wearing tight pants and pantyhose, especially during summer.  Limit the amount of alcohol that you drink.  Do not use any products that contain  nicotine or tobacco, such as cigarettes and e-cigarettes. If you need help quitting, ask your health care provider.  Do not use illegal drugs. Where to find more information  Centers for Disease Control and Prevention: www.cdc.gov/std  American Sexual Health Association (ASHA): www.ashastd.org  [place] Department of Health and Human Services, Office on Women's Health: www.womenshealth.gov/ or https://www.womenshealth.gov/a-z-topics/bacterial-vaginosis Contact a health care provider if:  Your symptoms do not improve, even after treatment.  You have more discharge or pain when urinating.  You have a fever.  You have pain in your abdomen.  You have pain during sex.  You have vaginal bleeding between periods. Summary  Bacterial vaginosis is a vaginal infection that occurs when the normal balance of bacteria in the vagina is disrupted.  Because bacterial vaginosis increases your risk for STIs (sexually transmitted infections), getting treated can help reduce your risk for chlamydia, gonorrhea, herpes, and HIV (human immunodeficiency virus). Treatment is also important for preventing complications in pregnant women, because the condition can cause an early (premature) delivery.  This condition is treated with antibiotic medicines. These may be given as a pill, a vaginal cream, or a medicine that is put into the vagina (suppository). This information is not intended to replace advice given to you by your health care provider. Make sure you discuss any questions you have with your health care provider. Document Revised: 04/29/2017 Document Reviewed: 01/31/2016 Elsevier Patient Education  2020 Elsevier Inc.  

## 2019-12-25 LAB — CERVICOVAGINAL ANCILLARY ONLY
Bacterial Vaginitis (gardnerella): NEGATIVE
Candida Glabrata: NEGATIVE
Candida Vaginitis: NEGATIVE
Chlamydia: NEGATIVE
Comment: NEGATIVE
Comment: NEGATIVE
Comment: NEGATIVE
Comment: NEGATIVE
Comment: NEGATIVE
Comment: NORMAL
Neisseria Gonorrhea: NEGATIVE
Trichomonas: NEGATIVE

## 2019-12-26 ENCOUNTER — Telehealth: Payer: Self-pay

## 2019-12-26 NOTE — Telephone Encounter (Signed)
-----   Message from Mar Daring, [place] sent at 12/25/2019  2:32 PM EDT ----- Cervical swab is completely negative. No BV or yeast noted. Can continue the clindamycin cream to see if symptoms improve. If they improve then complete treatment of 7 days. If not improving, may need re-evaluation.

## 2019-12-26 NOTE — Telephone Encounter (Signed)
Written by Mar Daring, PA-C on 12/25/2019 2:32 PM EDT Seen by patient Hannah Roberson on 12/25/2019 7:26 PM

## 2020-01-13 ENCOUNTER — Other Ambulatory Visit: Payer: Self-pay | Admitting: Physician Assistant

## 2020-01-13 DIAGNOSIS — I10 Essential (primary) hypertension: Secondary | ICD-10-CM

## 2020-01-13 NOTE — Telephone Encounter (Signed)
Requested medications are due for refill today?  Yes   Requested medications are on active medication list?  Yes  Last Refill:   10/22/2019  # 90 with no refills.    Future visit scheduled?  Yes in 3 weeks.    Notes to Clinic:   Medication failed RX refill protocol due to no labs within the past 180 days.  Last labs were performed on 06/05/2015.

## 2020-01-31 ENCOUNTER — Ambulatory Visit: Payer: Self-pay | Admitting: Family Medicine

## 2020-02-06 ENCOUNTER — Ambulatory Visit: Payer: Self-pay | Admitting: Family Medicine

## 2020-02-25 ENCOUNTER — Ambulatory Visit: Payer: Managed Care, Other (non HMO) | Admitting: Family Medicine

## 2020-02-25 ENCOUNTER — Other Ambulatory Visit: Payer: Self-pay

## 2020-02-25 ENCOUNTER — Encounter: Payer: Self-pay | Admitting: Family Medicine

## 2020-02-25 VITALS — BP 139/77 | HR 78 | Temp 97.8°F | Wt 227.0 lb

## 2020-02-25 DIAGNOSIS — I10 Essential (primary) hypertension: Secondary | ICD-10-CM

## 2020-02-25 DIAGNOSIS — E1159 Type 2 diabetes mellitus with other circulatory complications: Secondary | ICD-10-CM | POA: Insufficient documentation

## 2020-02-25 DIAGNOSIS — J301 Allergic rhinitis due to pollen: Secondary | ICD-10-CM | POA: Diagnosis not present

## 2020-02-25 MED ORDER — FLUTICASONE PROPIONATE 50 MCG/ACT NA SUSP: 2.0000 | Freq: Every day | NASAL | 6 refills | Status: DC

## 2020-02-25 NOTE — Assessment & Plan Note (Signed)
Restart flonase 

## 2020-02-25 NOTE — Assessment & Plan Note (Addendum)
Good control Goal <140/90, ideal <539 systolic Discussed home bp monitoring Continue losartan-HCTZ 12.5mg  Recheck blood work today F/u in 3 months

## 2020-02-25 NOTE — Progress Notes (Signed)
Established patient visit   Patient: Hannah Roberson   DOB: Jan 20, 1971   49 y.o. Female  MRN: [id number] Visit Date: 02/25/2020  Today's healthcare provider: Lavon Paganini, MD   Chief Complaint  Patient presents with  . Hypertension    Subjective    HPI   Allergic symptoms Started getting S&S (dust) put on fingernails in May; this caused her head/ears/eyes/feet to itch, sinuses to drain like crazy afterwards. The sinuses have continued to drain since May. She is getting everything taken off her nails and will not get them done again.   She does have a history of fall allergies. She would like refill prescription for flonase; been off for a while but feels like she needs it.  Hypertension, follow-up   BP Readings from Last 3 Encounters:  02/25/20 139/77  12/24/19 111/71  10/22/19 (!) 138/108   Wt Readings from Last 3 Encounters:  02/25/20 227 lb (103 kg)  10/22/19 232 lb 12.8 oz (105.6 kg)  07/25/19 244 lb 6.4 oz (110.9 kg)     She was last seen for hypertension 4 months ago.  BP at that visit was 138/108. Management since that visit includes starting Losartan-hydrochlorathiazide (hyzaar) 12.5mg .  She reports good compliance with treatment. Never forgets medication (sets reminders on her phone). She is not having side effects.  She is following a Regular diet. Has given up sugar. Drinks only water (6 16oz waters a day). She is exercising. Walks on treadmill or around block with husband. Tries to get 10,000 steps every day. Has exercise routine with weights 3x/week. Would like to learn about surgical options for weight loss. She does not smoke.  Use of agents associated with hypertension: none.   Outside blood pressures are not being checked.  Symptoms: No chest pain No chest pressure  No palpitations No syncope  No dyspnea No orthopnea  No paroxysmal nocturnal dyspnea No lower extremity edema   Pertinent labs: No results found for: CHOL, HDL, LDLCALC,  LDLDIRECT, TRIG, CHOLHDL Lab Results  Component Value Date   NA 137 06/05/2015   K 3.8 06/05/2015   CREATININE 0.54 06/05/2015   GFRNONAA >60 06/05/2015   GFRAA >60 06/05/2015   GLUCOSE 119 (H) 06/05/2015     The ASCVD Risk score Mikey Bussing DC Jr., et al., 2013) failed to calculate for the following reasons:   Cannot find a previous HDL lab   Cannot find a previous total cholesterol lab   ---------------------------------------------------------------------------------------------------  Past Medical History:  Diagnosis Date  . Allergic rhinitis   . Asthma, extrinsic   . Esophagitis, reflux        Medications: Outpatient Medications Prior to Visit  Medication Sig  . albuterol (PROAIR HFA) 108 (90 Base) MCG/ACT inhaler Inhale 2 puffs into the lungs every 6 (six) hours as needed for wheezing or shortness of breath.  . clindamycin (CLEOCIN) 2 % vaginal cream Place 1 Applicatorful vaginally at bedtime.  Marland Kitchen losartan-hydrochlorothiazide (HYZAAR) 50-12.5 MG tablet TAKE 1 TABLET BY MOUTH EVERY DAY  . valACYclovir (VALTREX) 1000 MG tablet Take two tablets every 12 hours for one day as onset of cold sore symptoms.   No facility-administered medications prior to visit.    Review of Systems  Constitutional: Negative.   HENT: Positive for postnasal drip.   Respiratory: Negative.   Cardiovascular: Negative.   Hematological: Negative.     Last CBC Lab Results  Component Value Date   WBC 18.1 (H) 06/05/2015   HGB 11.7 (L) 06/05/2015  HCT 36.4 06/05/2015   MCV 79.4 (L) 06/05/2015   MCH 25.5 (L) 06/05/2015   RDW 15.3 (H) 06/05/2015   PLT 310 61/95/0932   Last metabolic panel Lab Results  Component Value Date   GLUCOSE 119 (H) 06/05/2015   NA 137 06/05/2015   K 3.8 06/05/2015   CL 103 06/05/2015   CO2 25 06/05/2015   BUN 6 06/05/2015   CREATININE 0.54 06/05/2015   GFRNONAA >60 06/05/2015   GFRAA >60 06/05/2015   CALCIUM 9.0 06/05/2015   PROT 7.8 06/05/2015   ALBUMIN 4.0  06/05/2015   BILITOT 1.0 06/05/2015   ALKPHOS 121 06/05/2015   AST 39 06/05/2015   ALT 57 (H) 06/05/2015   ANIONGAP 9 06/05/2015    Objective    BP 139/77 (BP Location: Left Arm, Patient Position: Sitting, Cuff Size: Large)   Pulse 78   Temp 97.8 F (36.6 C) (Oral)   Wt 227 lb (103 kg)   LMP 02/21/2020 (Exact Date)   SpO2 97%   BMI 41.52 kg/m     Physical Exam Constitutional:      General: She is not in acute distress.    Appearance: Normal appearance. She is obese. She is not ill-appearing, toxic-appearing or diaphoretic.  HENT:     Head: Normocephalic.  Cardiovascular:     Rate and Rhythm: Normal rate and regular rhythm.     Heart sounds: Normal heart sounds. No murmur heard.  No friction rub. No gallop.   Pulmonary:     Effort: Pulmonary effort is normal.     Breath sounds: Normal breath sounds. No stridor. No wheezing or rales.  Musculoskeletal:     Cervical back: Normal range of motion and neck supple. No rigidity or tenderness.     Right lower leg: No edema.     Left lower leg: No edema.  Lymphadenopathy:     Cervical: No cervical adenopathy.  Skin:    General: Skin is warm and dry.  Neurological:     General: No focal deficit present.     Mental Status: She is alert and oriented to person, place, and time.  Psychiatric:        Mood and Affect: Mood normal.        Behavior: Behavior normal.        Thought Content: Thought content normal.        Judgment: Judgment normal.      No results found for any visits on 02/25/20.  Assessment & Plan     Problem List Items Addressed This Visit      Cardiovascular and Mediastinum   Essential hypertension - Primary    Good control Goal <140/90, ideal <671 systolic Discussed home bp monitoring Continue losartan-HCTZ 12.5mg  Recheck blood work today F/u in 3 months      Relevant Orders   Comprehensive metabolic panel     Respiratory   Allergic rhinitis    Restart flonase        Other   Obesity due to  excess calories, unspecified obesity severity    Patient has 17 lbs weight loss since February 2021 Discussed importance of healthy diet and exercise Discussed weight management clinic, surgical options for weight loss F/U in 3 months         Return in about 3 months (around 05/26/2020) for CPE.      Rodrigo Ran, MS3   Patient seen along with MS3 student Rodrigo Ran. I personally evaluated this patient along with the student, and verified  all aspects of the history, physical exam, and medical decision making as documented by the student. I agree with the student's documentation and have made all necessary edits.  Daelin Haste, Dionne Bucy, MD, MPH [employer]

## 2020-02-25 NOTE — Patient Instructions (Signed)
[facility] in [place]

## 2020-02-25 NOTE — Assessment & Plan Note (Signed)
Patient has 17 lbs weight loss since February 2021 Discussed importance of healthy diet and exercise Discussed weight management clinic, surgical options for weight loss F/U in 3 months

## 2020-02-26 LAB — COMPREHENSIVE METABOLIC PANEL
ALT: 31 IU/L (ref 0–32)
AST: 24 IU/L (ref 0–40)
Albumin/Globulin Ratio: 1.5 (ref 1.2–2.2)
Albumin: 4.1 g/dL (ref 3.8–4.8)
Alkaline Phosphatase: 194 IU/L — ABNORMAL HIGH (ref 44–121)
BUN/Creatinine Ratio: 16 (ref 9–23)
BUN: 9 mg/dL (ref 6–24)
Bilirubin Total: <0.2 mg/dL (ref 0.0–1.2)
CO2: 23 mmol/L (ref 20–29)
Calcium: 9.5 mg/dL (ref 8.7–10.2)
Chloride: 102 mmol/L (ref 96–106)
Creatinine, Ser: 0.58 mg/dL (ref 0.57–1.00)
GFR calc Af Amer: 126 mL/min/{1.73_m2} (ref >59)
GFR calc non Af Amer: 109 mL/min/{1.73_m2} (ref >59)
Globulin, Total: 2.8 g/dL (ref 1.5–4.5)
Glucose: 97 mg/dL (ref 65–99)
Potassium: 4.6 mmol/L (ref 3.5–5.2)
Sodium: 138 mmol/L (ref 134–144)
Total Protein: 6.9 g/dL (ref 6.0–8.5)

## 2020-02-26 LAB — TSH: TSH: 1.28 u[IU]/mL (ref 0.450–4.500)

## 2020-02-26 LAB — CBC WITH DIFFERENTIAL/PLATELET

## 2020-02-26 LAB — LIPID PANEL
Chol/HDL Ratio: 4.5 ratio — ABNORMAL HIGH (ref 0.0–4.4)
Cholesterol, Total: 192 mg/dL (ref 100–199)
HDL: 43 mg/dL (ref >39)
LDL Chol Calc (NIH): 122 mg/dL — ABNORMAL HIGH (ref 0–99)
Triglycerides: 153 mg/dL — ABNORMAL HIGH (ref 0–149)
VLDL Cholesterol Cal: 27 mg/dL (ref 5–40)

## 2020-02-26 LAB — HEMOGLOBIN A1C

## 2020-03-24 ENCOUNTER — Other Ambulatory Visit: Payer: Self-pay | Admitting: Obstetrics and Gynecology

## 2020-03-24 DIAGNOSIS — Z1231 Encounter for screening mammogram for malignant neoplasm of breast: Secondary | ICD-10-CM

## 2020-03-31 ENCOUNTER — Telehealth: Payer: Self-pay

## 2020-03-31 NOTE — Telephone Encounter (Signed)
Copied from [place] [id number]. Topic: General - Other >> Mar 31, 2020  1:22 PM Keene Breath wrote: Reason for CRM: Patient would like to start taking Biotin but is not sure if it will counter act with her current medications.  Please have the nurse call to discuss further at [phone]

## 2020-04-01 NOTE — Telephone Encounter (Signed)
Biotin will not interfere with her other medications.  I can, however, interfere with testing TSH for thyroid disease.  It also has not been shown to have benefit in hair loss.  She may do better with a hair and skin vitamin instead.

## 2020-04-01 NOTE — Telephone Encounter (Signed)
Advised pt. She reports that she was taking Biotin for her nails. She had an allergic reaction to the acrylic that goes on your nails at the nail salon. She says that it may be fungal. They are brittle and keep breaking. Would the hair and skin vitamin be beneficial for that? Please advise. Thanks!

## 2020-04-01 NOTE — Telephone Encounter (Signed)
It would be for brittle nails.  Would not be for fungus.  Can buy a OTC medicine that goes on like nail polish for fungal infection though.

## 2020-04-01 NOTE — Telephone Encounter (Signed)
Advised pt as below.  

## 2020-04-15 ENCOUNTER — Other Ambulatory Visit: Payer: Self-pay | Admitting: Family Medicine

## 2020-04-15 DIAGNOSIS — I10 Essential (primary) hypertension: Secondary | ICD-10-CM

## 2020-04-15 NOTE — Telephone Encounter (Signed)
Requested Prescriptions  Pending Prescriptions Disp Refills  . losartan-hydrochlorothiazide (HYZAAR) 50-12.5 MG tablet [Pharmacy Med Name: LOSARTAN-HCTZ 50-12.5 MG TAB] 90 tablet 0    Sig: TAKE 1 TABLET BY MOUTH EVERY DAY     Cardiovascular: ARB + Diuretic Combos Passed - 04/15/2020  9:07 AM      Passed - K in normal range and within 180 days    Potassium  Date Value Ref Range Status  02/25/2020 4.6 3.5 - 5.2 mmol/L Final  11/17/2011 4.2 3.5 - 5.1 mmol/L Final         Passed - Na in normal range and within 180 days    Sodium  Date Value Ref Range Status  02/25/2020 138 134 - 144 mmol/L Final  11/17/2011 140 136 - 145 mmol/L Final         Passed - Cr in normal range and within 180 days    Creatinine  Date Value Ref Range Status  11/17/2011 0.68 0.60 - 1.30 mg/dL Final   Creatinine, Ser  Date Value Ref Range Status  02/25/2020 0.58 0.57 - 1.00 mg/dL Final         Passed - Ca in normal range and within 180 days    Calcium  Date Value Ref Range Status  02/25/2020 9.5 8.7 - 10.2 mg/dL Final   Calcium, Total  Date Value Ref Range Status  11/17/2011 8.8 8.5 - 10.1 mg/dL Final         Passed - Patient is not pregnant      Passed - Last BP in normal range    BP Readings from Last 1 Encounters:  02/25/20 139/77         Passed - Valid encounter within last 6 months    Recent Outpatient Visits          1 month ago Essential hypertension   [place], Dionne Bucy, MD   3 months ago Possible urinary tract infection   [facility], [place], [place]   5 months ago Essential hypertension   [place], [place], [place]   8 months ago Essential hypertension   [place], [place], [place]   10 months ago Bacterial vaginitis   [facility] [place], [place]      Future Appointments            In 2 months Bacigalupo, Dionne Bucy, MD [facility], [place]

## 2020-04-30 ENCOUNTER — Ambulatory Visit
Admission: RE | Admit: 2020-04-30 | Discharge: 2020-04-30 | Disposition: A | Discharge status: Home / Self Care | Payer: Managed Care, Other (non HMO) | Source: Ambulatory Visit | Attending: Obstetrics and Gynecology | Admitting: Obstetrics and Gynecology

## 2020-04-30 ENCOUNTER — Other Ambulatory Visit: Payer: Self-pay

## 2020-04-30 DIAGNOSIS — Z1231 Encounter for screening mammogram for malignant neoplasm of breast: Secondary | ICD-10-CM | POA: Diagnosis present

## 2020-05-26 ENCOUNTER — Telehealth: Payer: Self-pay

## 2020-05-26 NOTE — Telephone Encounter (Signed)
Copied from [place] [id number]. Topic: General - Other >> May 26, 2020 12:18 PM Hinda Lenis D wrote: PT call billing about a charge $300 plus / person at billing was rude an told PT she was charge for STD testing, PT was unaware of this testing an requesting to speak with the office manger / please advise

## 2020-05-26 NOTE — Telephone Encounter (Signed)
Per note from July she was having vaginal discharge and underwent the vaginal swab for testing for the discharge. This includes testing for BV, yeast and STDs like gonorrhea, chlamydia, and trichomonas. It is all in one test.   I do not control billing or insurance coverage. She can talk to Maralyn Sago, or someone else/supervisor in billing instead.

## 2020-06-16 ENCOUNTER — Encounter: Payer: Managed Care, Other (non HMO) | Admitting: Family Medicine

## 2020-06-26 ENCOUNTER — Ambulatory Visit: Payer: Managed Care, Other (non HMO) | Admitting: Family Medicine

## 2020-07-01 ENCOUNTER — Other Ambulatory Visit: Payer: Self-pay

## 2020-07-01 ENCOUNTER — Encounter: Payer: Self-pay | Admitting: Family Medicine

## 2020-07-01 ENCOUNTER — Ambulatory Visit (INDEPENDENT_AMBULATORY_CARE_PROVIDER_SITE_OTHER): Payer: Managed Care, Other (non HMO) | Admitting: Family Medicine

## 2020-07-01 VITALS — BP 138/84 | HR 84 | Temp 98.0°F | Resp 16 | Ht 62.0 in | Wt 233.0 lb

## 2020-07-01 DIAGNOSIS — Z0000 Encounter for general adult medical examination without abnormal findings: Secondary | ICD-10-CM | POA: Diagnosis not present

## 2020-07-01 DIAGNOSIS — F411 Generalized anxiety disorder: Secondary | ICD-10-CM | POA: Diagnosis not present

## 2020-07-01 DIAGNOSIS — Z2821 Immunization not carried out because of patient refusal: Secondary | ICD-10-CM

## 2020-07-01 DIAGNOSIS — N921 Excessive and frequent menstruation with irregular cycle: Secondary | ICD-10-CM

## 2020-07-01 DIAGNOSIS — I10 Essential (primary) hypertension: Secondary | ICD-10-CM | POA: Diagnosis not present

## 2020-07-01 DIAGNOSIS — Z6841 Body Mass Index (BMI) 40.0 and over, adult: Secondary | ICD-10-CM

## 2020-07-01 DIAGNOSIS — Z1159 Encounter for screening for other viral diseases: Secondary | ICD-10-CM

## 2020-07-01 DIAGNOSIS — Z23 Encounter for immunization: Secondary | ICD-10-CM | POA: Diagnosis not present

## 2020-07-01 MED ORDER — HYDROXYZINE HCL 10 MG PO TABS: 10.0000 mg | ORAL_TABLET | Freq: Three times a day (TID) | ORAL | 0 refills | Status: DC | PRN

## 2020-07-01 NOTE — Addendum Note (Signed)
Addended by: Shawna Orleans on: 07/01/2020 01:14 PM   Modules accepted: Orders

## 2020-07-01 NOTE — Patient Instructions (Signed)
Preventive Care 49-49 Years Old, Female Preventive care refers to lifestyle choices and visits with your health care provider that can promote health and wellness. This includes:  A yearly physical exam. This is also called an annual wellness visit.  Regular dental and eye exams.  Immunizations.  Screening for certain conditions.  Healthy lifestyle choices, such as: ? Eating a healthy diet. ? Getting regular exercise. ? Not using drugs or products that contain nicotine and tobacco. ? Limiting alcohol use. What can I expect for my preventive care visit? Physical exam Your health care provider will check your:  Height and weight. These may be used to calculate your BMI (body mass index). BMI is a measurement that tells if you are at a healthy weight.  Heart rate and blood pressure.  Body temperature.  Skin for abnormal spots. Counseling Your health care provider may ask you questions about your:  Past medical problems.  Family's medical history.  Alcohol, tobacco, and drug use.  Emotional well-being.  Home life and relationship well-being.  Sexual activity.  Diet, exercise, and sleep habits.  Work and work Statistician.  Access to firearms.  Method of birth control.  Menstrual cycle.  Pregnancy history. What immunizations do I need? Vaccines are usually given at various ages, according to a schedule. Your health care provider will recommend vaccines for you based on your age, medical history, and lifestyle or other factors, such as travel or where you work.   What tests do I need? Blood tests  Lipid and cholesterol levels. These may be checked every 5 years, or more often if you are over 49 years old.  Hepatitis C test.  Hepatitis B test. Screening  Lung cancer screening. You may have this screening every year starting at age 49 if you have a 30-pack-year history of smoking and currently smoke or have quit within the past 15 years.  Colorectal cancer  screening. ? All adults should have this screening starting at age 49 and continuing until age 49. ? Your health care provider may recommend screening at age 49 if you are at increased risk. ? You will have tests every 1-10 years, depending on your results and the type of screening test.  Diabetes screening. ? This is done by checking your blood sugar (glucose) after you have not eaten for a while (fasting). ? You may have this done every 1-3 years.  Mammogram. ? This may be done every 1-2 years. ? Talk with your health care provider about when you should start having regular mammograms. This may depend on whether you have a family history of breast cancer.  BRCA-related cancer screening. This may be done if you have a family history of breast, ovarian, tubal, or peritoneal cancers.  Pelvic exam and Pap test. ? This may be done every 3 years starting at age 49. ? Starting at age 49, this may be done every 5 years if you have a Pap test in combination with an HPV test. Other tests  STD (sexually transmitted disease) testing, if you are at risk.  Bone density scan. This is done to screen for osteoporosis. You may have this scan if you are at high risk for osteoporosis. Talk with your health care provider about your test results, treatment options, and if necessary, the need for more tests. Follow these instructions at home: Eating and drinking  Eat a diet that includes fresh fruits and vegetables, whole grains, lean protein, and low-fat dairy products.  Take vitamin and mineral supplements  as recommended by your health care provider.  Do not drink alcohol if: ? Your health care provider tells you not to drink. ? You are pregnant, may be pregnant, or are planning to become pregnant.  If you drink alcohol: ? Limit how much you have to 0-1 drink a day. ? Be aware of how much alcohol is in your drink. In the [place] one drink equals one 12 oz bottle of beer (355 mL), one 5 oz glass of  wine (148 mL), or one 1 oz glass of hard liquor (44 mL).   Lifestyle  Take daily care of your teeth and gums. Brush your teeth every morning and night with fluoride toothpaste. Floss one time each day.  Stay active. Exercise for at least 30 minutes 5 or more days each week.  Do not use any products that contain nicotine or tobacco, such as cigarettes, e-cigarettes, and chewing tobacco. If you need help quitting, ask your health care provider.  Do not use drugs.  If you are sexually active, practice safe sex. Use a condom or other form of protection to prevent STIs (sexually transmitted infections).  If you do not wish to become pregnant, use a form of birth control. If you plan to become pregnant, see your health care provider for a prepregnancy visit.  If told by your health care provider, take low-dose aspirin daily starting at age 49.  Find healthy ways to cope with stress, such as: ? Meditation, yoga, or listening to music. ? Journaling. ? Talking to a trusted person. ? Spending time with friends and family. Safety  Always wear your seat belt while driving or riding in a vehicle.  Do not drive: ? If you have been drinking alcohol. Do not ride with someone who has been drinking. ? When you are tired or distracted. ? While texting.  Wear a helmet and other protective equipment during sports activities.  If you have firearms in your house, make sure you follow all gun safety procedures. What's next?  Visit your health care provider once a year for an annual wellness visit.  Ask your health care provider how often you should have your eyes and teeth checked.  Stay up to date on all vaccines. This information is not intended to replace advice given to you by your health care provider. Make sure you discuss any questions you have with your health care provider. Document Revised: 02/19/2020 Document Reviewed: 01/26/2018 Elsevier Patient Education  2021 [employer].

## 2020-07-01 NOTE — Assessment & Plan Note (Signed)
Counseling given. 

## 2020-07-01 NOTE — Telephone Encounter (Signed)
Spoke to the patient and explained to her what the provider said.  She was more upset with the fact she was tested for STD's and didn't know anything about it per patient.    Told patient we did apologize for any confusion however that is they typical test and even if you have been with the same partner for years we would still do the same testing to rule out anything.

## 2020-07-01 NOTE — Assessment & Plan Note (Signed)
Longstanding but worsening  Discussed that first-line treatment for anxiety is therapy plus or minus SSRI Patient is very adamant that she does not want to take a daily medication and feels that she does not need therapy Discussed avoidance of benzos as these are not appropriate treatment for chronic anxiety Can use hydroxyzine as needed

## 2020-07-01 NOTE — Assessment & Plan Note (Signed)
Discussed importance of healthy weight management Discussed diet and exercise  

## 2020-07-01 NOTE — Progress Notes (Signed)
Complete physical exam   Patient: Hannah Roberson   DOB: 28-Jun-1970   50 y.o. Female  MRN: [id number] Visit Date: 07/01/2020  Today's healthcare provider: Lavon Paganini, MD   Chief Complaint  Patient presents with  . Annual Exam   Subjective    Hannah Roberson is a 50 y.o. female who presents today for a complete physical exam.    She reports consuming a general diet. The patient does not participate in regular exercise at present. She generally feels fairly well. She reports sleeping fairly well. She does have additional problems to discuss today.  Reports that anxiety is much higher.  She does not want to take a daily medication for this. HPI    Past Medical History:  Diagnosis Date  . Allergic rhinitis   . Asthma, extrinsic   . Esophagitis, reflux    Past Surgical History:  Procedure Laterality Date  . Bile duct obstruction     patient has had 2 surgeries on liver to obstruction   . CHOLECYSTECTOMY    . TUBAL LIGATION  1998   Social History   Socioeconomic History  . Marital status: Married    Spouse name: Not on file  . Number of children: Not on file  . Years of education: Not on file  . Highest education level: Not on file  Occupational History  . Not on file  Tobacco Use  . Smoking status: Former Smoker    Quit date: 09/27/2013    Years since quitting: 6.7  . Smokeless tobacco: Never Used  Substance and Sexual Activity  . Alcohol use: Yes    Alcohol/week: 0.0 standard drinks  . Drug use: No  . Sexual activity: Not on file  Other Topics Concern  . Not on file  Social History Narrative  . Not on file   Social Determinants of Health   Financial Resource Strain: Not on file  Food Insecurity: Not on file  Transportation Needs: Not on file  Physical Activity: Not on file  Stress: Not on file  Social Connections: Not on file  Intimate Partner Violence: Not on file   Family Status  Relation Name Status  . Mother  Alive  . Father   Deceased at age 10       accident  . Sister  Alive  . Brother  Alive  . MGF  Deceased  . Mat Aunt  (Not Specified)   Family History  Problem Relation Age of Onset  . Healthy Mother   . Diabetes Sister   . Diabetes Brother   . Cancer Maternal Grandfather        lung cancer  . Breast cancer Maternal Aunt    Allergies  Allergen Reactions  . Metronidazole Hives and Shortness Of Breath    Other reaction(s): Angioedema Has previously used without reaction before. New onset allergy 06/2019. Given benadryl and inhaler    Patient Care Team: Virginia Crews, MD as PCP - General (Family Medicine)   Medications: Outpatient Medications Prior to Visit  Medication Sig  . albuterol (PROAIR HFA) 108 (90 Base) MCG/ACT inhaler Inhale 2 puffs into the lungs every 6 (six) hours as needed for wheezing or shortness of breath.  . losartan-hydrochlorothiazide (HYZAAR) 50-12.5 MG tablet TAKE 1 TABLET BY MOUTH EVERY DAY  . valACYclovir (VALTREX) 1000 MG tablet Take two tablets every 12 hours for one day as onset of cold sore symptoms.  . [DISCONTINUED] clindamycin (CLEOCIN) 2 % vaginal cream Place 1 Applicatorful  vaginally at bedtime.  . [DISCONTINUED] fluticasone (FLONASE) 50 MCG/ACT nasal spray Place 2 sprays into both nostrils daily.   No facility-administered medications prior to visit.    Review of Systems  Constitutional: Negative.   HENT: Positive for hearing loss. Negative for congestion, dental problem, drooling, ear discharge, ear pain, facial swelling, mouth sores, nosebleeds, postnasal drip, rhinorrhea, sinus pressure, sinus pain, sneezing, sore throat, tinnitus, trouble swallowing and voice change.   Eyes: Negative.   Respiratory: Negative.   Cardiovascular: Negative.   Gastrointestinal: Negative.   Endocrine: Negative.   Genitourinary: Negative.   Musculoskeletal: Negative.   Skin: Negative.   Allergic/Immunologic: Negative.   Neurological: Negative.   Hematological:  Negative.   Psychiatric/Behavioral: Negative for agitation, behavioral problems, confusion, decreased concentration, dysphoric mood, hallucinations, self-injury, sleep disturbance and suicidal ideas. The patient is nervous/anxious. The patient is not hyperactive.     Last CBC Lab Results  Component Value Date   WBC CANCELED 02/25/2020   HGB CANCELED 02/25/2020   HCT CANCELED 02/25/2020   MCV 79.4 (L) 06/05/2015   MCH 25.5 (L) 06/05/2015   RDW 15.3 (H) 06/05/2015   PLT CANCELED [id number]   Last metabolic panel Lab Results  Component Value Date   GLUCOSE 97 02/25/2020   NA 138 02/25/2020   K 4.6 02/25/2020   CL 102 02/25/2020   CO2 23 02/25/2020   BUN 9 02/25/2020   CREATININE 0.58 02/25/2020   GFRNONAA 109 02/25/2020   GFRAA 126 02/25/2020   CALCIUM 9.5 02/25/2020   PROT 6.9 02/25/2020   ALBUMIN 4.1 02/25/2020   LABGLOB 2.8 02/25/2020   AGRATIO 1.5 02/25/2020   BILITOT <0.2 02/25/2020   ALKPHOS 194 (H) 02/25/2020   AST 24 02/25/2020   ALT 31 02/25/2020   ANIONGAP 9 06/05/2015   Last lipids Lab Results  Component Value Date   CHOL 192 02/25/2020   HDL 43 02/25/2020   LDLCALC 122 (H) 02/25/2020   TRIG 153 (H) 02/25/2020   CHOLHDL 4.5 (H) 02/25/2020   Last hemoglobin A1c Lab Results  Component Value Date   HGBA1C CANCELED 02/25/2020   Last thyroid functions Lab Results  Component Value Date   TSH 1.280 02/25/2020      Objective    BP 138/84 (BP Location: Left Arm, Patient Position: Sitting, Cuff Size: Large)   Pulse 84   Temp 98 F (36.7 C) (Oral)   Resp 16   Ht 5\' 2"  (1.575 m)   Wt 233 lb (105.7 kg)   LMP 05/19/2020 (Exact Date)   SpO2 98%   BMI 42.62 kg/m  BP Readings from Last 3 Encounters:  07/01/20 138/84  02/25/20 139/77  12/24/19 111/71   Wt Readings from Last 3 Encounters:  07/01/20 233 lb (105.7 kg)  02/25/20 227 lb (103 kg)  10/22/19 232 lb 12.8 oz (105.6 kg)      Physical Exam Vitals reviewed.  Constitutional:      General:  She is not in acute distress.    Appearance: Normal appearance. She is well-developed. She is not diaphoretic.  HENT:     Head: Normocephalic and atraumatic.     Right Ear: Tympanic membrane, ear canal and external ear normal.     Left Ear: Tympanic membrane, ear canal and external ear normal.  Eyes:     General: No scleral icterus.    Conjunctiva/sclera: Conjunctivae normal.     Pupils: Pupils are equal, round, and reactive to light.  Neck:     Thyroid: No thyromegaly.  Cardiovascular:  Rate and Rhythm: Normal rate and regular rhythm.     Pulses: Normal pulses.     Heart sounds: Normal heart sounds. No murmur heard.   Pulmonary:     Effort: Pulmonary effort is normal. No respiratory distress.     Breath sounds: Normal breath sounds. No wheezing or rales.  Abdominal:     General: There is no distension.     Palpations: Abdomen is soft.     Tenderness: There is no abdominal tenderness.  Musculoskeletal:        General: No deformity.     Cervical back: Neck supple.     Right lower leg: No edema.     Left lower leg: No edema.  Lymphadenopathy:     Cervical: No cervical adenopathy.  Skin:    General: Skin is warm and dry.     Findings: No rash.  Neurological:     Mental Status: She is alert and oriented to person, place, and time. Mental status is at baseline.     Sensory: No sensory deficit.     Motor: No weakness.     Gait: Gait normal.  Psychiatric:        Mood and Affect: Affect normal. Mood is anxious.        Behavior: Behavior normal.        Thought Content: Thought content does not include homicidal or suicidal ideation.      Last depression screening scores PHQ 2/9 Scores 07/01/2020 02/25/2020  PHQ - 2 Score 0 0  PHQ- 9 Score 4 0   Last fall risk screening Fall Risk  07/01/2020  Falls in the past year? 0  Number falls in past yr: 0  Injury with Fall? 0  Risk for fall due to : No Fall Risks  Follow up Falls evaluation completed   Last Audit-C alcohol use  screening Alcohol Use Disorder Test (AUDIT) 07/01/2020  1. How often do you have a drink containing alcohol? 0  2. How many drinks containing alcohol do you have on a typical day when you are drinking? 0  3. How often do you have six or more drinks on one occasion? 0  AUDIT-C Score 0  Alcohol Brief Interventions/Follow-up AUDIT Score <7 follow-up not indicated   A score of 3 or more in women, and 4 or more in men indicates increased risk for alcohol abuse, EXCEPT if all of the points are from question 1   No results found for any visits on 07/01/20.  Assessment & Plan    Routine Health Maintenance and Physical Exam  Exercise Activities and Dietary recommendations Goals   None      There is no immunization history on file for this patient.  Health Maintenance  Topic Date Due  . Hepatitis C Screening  Never done  . HIV Screening  Never done  . TETANUS/TDAP  Never done  . COLONOSCOPY (Pts 45-59yrs Insurance coverage will need to be confirmed)  Never done  . INFLUENZA VACCINE  08/28/2020 (Originally 12/30/2019)  . COVID-19 Vaccine (1) 12/28/2020 (Originally 06/23/1976)  . PAP SMEAR-Modifier  01/04/2022    Discussed health benefits of physical activity, and encouraged her to engage in regular exercise appropriate for her age and condition.  ROI sent for last colonoscopy  Problem List Items Addressed This Visit      Cardiovascular and Mediastinum   Essential hypertension    Well controlled Continue current medications Recheck metabolic panel F/u in 6 months  Relevant Orders   Comprehensive metabolic panel     Other   GAD (generalized anxiety disorder)    Longstanding but worsening  Discussed that first-line treatment for anxiety is therapy plus or minus SSRI Patient is very adamant that she does not want to take a daily medication and feels that she does not need therapy Discussed avoidance of benzos as these are not appropriate treatment for chronic anxiety Can  use hydroxyzine as needed      Relevant Medications   hydrOXYzine (ATARAX/VISTARIL) 10 MG tablet   Obesity due to excess calories, unspecified obesity severity    Discussed importance of healthy weight management Discussed diet and exercise       Menorrhagia with irregular cycle    Recheck CBC Cycles are now irregular as she is perimenopausal      Relevant Orders   CBC   COVID-19 vaccination declined    Counseling given       Other Visit Diagnoses    Encounter for annual physical exam    -  Primary   Relevant Orders   Hepatitis C Antibody   Comprehensive metabolic panel   CBC   BMI 40.0-44.9, adult ([place])       Need for hepatitis C screening test       Relevant Orders   Hepatitis C Antibody       Return in about 6 months (around 12/29/2020) for chronic disease f/u.     I, Lavon Paganini, MD, have reviewed all documentation for this visit. The documentation on 07/01/20 for the exam, diagnosis, procedures, and orders are all accurate and complete.   Jaxton Casale, Dionne Bucy, MD, MPH [facility]

## 2020-07-01 NOTE — Assessment & Plan Note (Signed)
Recheck CBC Cycles are now irregular as she is perimenopausal

## 2020-07-01 NOTE — Assessment & Plan Note (Signed)
Well controlled Continue current medications Recheck metabolic panel F/u in 6 months  

## 2020-07-02 LAB — COMPREHENSIVE METABOLIC PANEL
ALT: 35 IU/L — ABNORMAL HIGH (ref 0–32)
AST: 31 IU/L (ref 0–40)
Albumin/Globulin Ratio: 1.3 (ref 1.2–2.2)
Albumin: 3.9 g/dL (ref 3.8–4.8)
Alkaline Phosphatase: 161 IU/L — ABNORMAL HIGH (ref 44–121)
BUN/Creatinine Ratio: 16 (ref 9–23)
BUN: 9 mg/dL (ref 6–24)
Bilirubin Total: 0.3 mg/dL (ref 0.0–1.2)
CO2: 21 mmol/L (ref 20–29)
Calcium: 9.2 mg/dL (ref 8.7–10.2)
Chloride: 101 mmol/L (ref 96–106)
Creatinine, Ser: 0.58 mg/dL (ref 0.57–1.00)
GFR calc Af Amer: 125 mL/min/{1.73_m2} (ref >59)
GFR calc non Af Amer: 109 mL/min/{1.73_m2} (ref >59)
Globulin, Total: 2.9 g/dL (ref 1.5–4.5)
Glucose: 103 mg/dL — ABNORMAL HIGH (ref 65–99)
Potassium: 4.2 mmol/L (ref 3.5–5.2)
Sodium: 137 mmol/L (ref 134–144)
Total Protein: 6.8 g/dL (ref 6.0–8.5)

## 2020-07-02 LAB — HEPATITIS C ANTIBODY: Hep C Virus Ab: <0.1 s/co ratio (ref 0.0–0.9)

## 2020-07-02 LAB — CBC
Hematocrit: 38.3 % (ref 34.0–46.6)
Hemoglobin: 12.2 g/dL (ref 11.1–15.9)
MCH: 24.6 pg — ABNORMAL LOW (ref 26.6–33.0)
MCHC: 31.9 g/dL (ref 31.5–35.7)
MCV: 77 fL — ABNORMAL LOW (ref 79–97)
Platelets: 385 10*3/uL (ref 150–450)
RBC: 4.96 x10E6/uL (ref 3.77–5.28)
RDW: 16.2 % — ABNORMAL HIGH (ref 11.7–15.4)
WBC: 9.7 10*3/uL (ref 3.4–10.8)

## 2020-07-13 ENCOUNTER — Other Ambulatory Visit: Payer: Self-pay | Admitting: Family Medicine

## 2020-07-13 DIAGNOSIS — I10 Essential (primary) hypertension: Secondary | ICD-10-CM

## 2020-08-18 ENCOUNTER — Other Ambulatory Visit: Payer: Self-pay | Admitting: Physician Assistant

## 2020-08-18 DIAGNOSIS — B001 Herpesviral vesicular dermatitis: Secondary | ICD-10-CM

## 2020-08-18 NOTE — Telephone Encounter (Signed)
Requested Prescriptions  Pending Prescriptions Disp Refills  . valACYclovir (VALTREX) 1000 MG tablet [Pharmacy Med Name: VALACYCLOVIR HCL 1 GRAM TABLET] 16 tablet 0    Sig: TAKE TWO TABLETS EVERY 12 HOURS FOR ONE DAY AS ONSET OF COLD SORE SYMPTOMS.     Antimicrobials:  Antiviral Agents - Anti-Herpetic Passed - 08/18/2020  8:32 AM      Passed - Valid encounter within last 12 months    Recent Outpatient Visits          1 month ago Encounter for annual physical exam   [facility] [place], Dionne Bucy, MD   5 months ago Essential hypertension   [facility] [place], Dionne Bucy, MD   7 months ago Possible urinary tract infection   [facility], Clearnce Sorrel, [place]   10 months ago Essential hypertension   [facility] [place], Wendee Beavers, [place]   1 year ago Essential hypertension   [facility] [place], [place]      Future Appointments            In 4 months Bacigalupo, Dionne Bucy, MD [facility], [place]

## 2020-10-13 ENCOUNTER — Other Ambulatory Visit: Payer: Self-pay | Admitting: Family Medicine

## 2020-10-13 DIAGNOSIS — I10 Essential (primary) hypertension: Secondary | ICD-10-CM

## 2020-12-30 ENCOUNTER — Other Ambulatory Visit: Payer: Self-pay

## 2020-12-30 ENCOUNTER — Encounter: Payer: Self-pay | Admitting: Family Medicine

## 2020-12-30 ENCOUNTER — Ambulatory Visit: Payer: Managed Care, Other (non HMO) | Admitting: Family Medicine

## 2020-12-30 VITALS — BP 119/81 | HR 76 | Temp 98.2°F | Resp 16 | Ht 62.0 in | Wt 232.0 lb

## 2020-12-30 DIAGNOSIS — E782 Mixed hyperlipidemia: Secondary | ICD-10-CM | POA: Insufficient documentation

## 2020-12-30 DIAGNOSIS — I10 Essential (primary) hypertension: Secondary | ICD-10-CM

## 2020-12-30 NOTE — Assessment & Plan Note (Signed)
Discussed importance of healthy weight management Discussed diet and exercise  

## 2020-12-30 NOTE — Assessment & Plan Note (Signed)
Well controlled Continue current medications Recheck metabolic panel F/u in 6 months  

## 2020-12-30 NOTE — Patient Instructions (Signed)
[facility] in [place]

## 2020-12-30 NOTE — Assessment & Plan Note (Signed)
Reviewed last lipid panel Not currently on a statin Recheck FLP and CMP Discussed diet and exercise  

## 2020-12-30 NOTE — Progress Notes (Signed)
Established patient visit   Patient: Hannah Roberson   DOB: November 30, 1970   50 y.o. Female  MRN: [id number] Visit Date: 12/30/2020  Today's healthcare provider: Lavon Paganini, MD   Chief Complaint  Patient presents with   Hypertension    Subjective    Hypertension Pertinent negatives include no chest pain, headaches, neck pain, palpitations or shortness of breath.    Hypertension, follow-up  BP Readings from Last 3 Encounters:  12/30/20 119/81  07/01/20 138/84  02/25/20 139/77   Wt Readings from Last 3 Encounters:  12/30/20 232 lb (105.2 kg)  07/01/20 233 lb (105.7 kg)  02/25/20 227 lb (103 kg)     She was last seen for hypertension 6 months ago.  BP at that visit was 138/84. Management since that visit includes no medication changes.  She reports good compliance with treatment. She is not having side effects.  She is following a Regular diet. She is exercising. Walks a mile every morning.  She does not smoke.  Outside blood pressures are checked occasionally.  Symptoms: No chest pain No chest pressure  No palpitations No syncope  No dyspnea No orthopnea  No paroxysmal nocturnal dyspnea No lower extremity edema   Pertinent labs: Lab Results  Component Value Date   CHOL 192 02/25/2020   HDL 43 02/25/2020   LDLCALC 122 (H) 02/25/2020   TRIG 153 (H) 02/25/2020   CHOLHDL 4.5 (H) 02/25/2020   Lab Results  Component Value Date   NA 137 07/01/2020   K 4.2 07/01/2020   CREATININE 0.58 07/01/2020   GFRNONAA 109 07/01/2020   GFRAA 125 07/01/2020   GLUCOSE 103 (H) 07/01/2020     The 10-year ASCVD risk score Mikey Bussing DC Jr., et al., 2013) is: 1.7%   Weight loss  She does 1 mile on the treadmill every morning and she use her weight bench. She noticed that she gained strength and loss in some inches. However she is frustrated she is not losing the weight. She has been intermittent fasting. She doesn't eat after 8pm or before 12pm and she reports this  overall lifestyle makes her feel better. She is amenable to a researching healthy weight and wellness located in [place].   Insurance coverage  She reports that with her last visit as a physical she had a bill of $400 because insurance hasn't covered it.      Medications: Outpatient Medications Prior to Visit  Medication Sig   albuterol (PROAIR HFA) 108 (90 Base) MCG/ACT inhaler Inhale 2 puffs into the lungs every 6 (six) hours as needed for wheezing or shortness of breath.   losartan-hydrochlorothiazide (HYZAAR) 50-12.5 MG tablet TAKE 1 TABLET BY MOUTH EVERY DAY   valACYclovir (VALTREX) 1000 MG tablet TAKE TWO TABLETS EVERY 12 HOURS FOR ONE DAY AS ONSET OF COLD SORE SYMPTOMS.   [DISCONTINUED] hydrOXYzine (ATARAX/VISTARIL) 10 MG tablet Take 1 tablet (10 mg total) by mouth 3 (three) times daily as needed. (Patient not taking: Reported on 12/30/2020)   No facility-administered medications prior to visit.    Review of Systems  Constitutional:  Negative for activity change, chills, fatigue and fever.  HENT:  Negative for ear pain, sinus pressure, sinus pain and sore throat.   Eyes:  Negative for pain and visual disturbance.  Respiratory:  Negative for cough, chest tightness, shortness of breath and wheezing.   Cardiovascular:  Negative for chest pain, palpitations and leg swelling.  Gastrointestinal:  Negative for abdominal pain, blood in stool, diarrhea, nausea  and vomiting.  Genitourinary:  Negative for flank pain, frequency, pelvic pain and urgency.  Musculoskeletal:  Negative for arthralgias, joint swelling, myalgias and neck pain.  Neurological:  Negative for dizziness, weakness, light-headedness, numbness and headaches.      Objective    BP 119/81   Pulse 76   Temp 98.2 F (36.8 C)   Resp 16   Ht '5\' 2"'$  (1.575 m)   Wt 232 lb (105.2 kg)   BMI 42.43 kg/m     Physical Exam Vitals reviewed.  Constitutional:      General: She is not in acute distress.    Appearance: Normal  appearance. She is well-developed. She is not diaphoretic.  HENT:     Head: Normocephalic and atraumatic.  Eyes:     General: No scleral icterus.    Conjunctiva/sclera: Conjunctivae normal.  Neck:     Thyroid: No thyromegaly.  Cardiovascular:     Rate and Rhythm: Normal rate and regular rhythm.     Pulses: Normal pulses.     Heart sounds: Normal heart sounds. No murmur heard. Pulmonary:     Effort: Pulmonary effort is normal. No respiratory distress.     Breath sounds: Normal breath sounds. No wheezing, rhonchi or rales.  Musculoskeletal:     Cervical back: Neck supple.     Right lower leg: No edema.     Left lower leg: No edema.  Lymphadenopathy:     Cervical: No cervical adenopathy.  Skin:    General: Skin is warm and dry.     Findings: No rash.  Neurological:     Mental Status: She is alert and oriented to person, place, and time. Mental status is at baseline.  Psychiatric:        Mood and Affect: Mood normal.        Behavior: Behavior normal.     No results found for any visits on 12/30/20.  Assessment & Plan     Problem List Items Addressed This Visit       Cardiovascular and Mediastinum   Essential hypertension - Primary    Well controlled Continue current medications Recheck metabolic panel F/u in 6 months        Relevant Orders   Comprehensive metabolic panel   Lipid panel     Other   Morbid obesity ([place])    Discussed importance of healthy weight management Discussed diet and exercise        Relevant Orders   Comprehensive metabolic panel   Lipid panel   Moderate mixed hyperlipidemia not requiring statin therapy    Reviewed last lipid panel Not currently on a statin Recheck FLP and CMP Discussed diet and exercise       Relevant Orders   Comprehensive metabolic panel   Lipid panel    Return in about 6 months (around 07/02/2021) for CPE.      I,Essence Turner,acting as a Education administrator for Lavon Paganini, MD.,have documented all relevant  documentation on the behalf of Lavon Paganini, MD,as directed by  Lavon Paganini, MD while in the presence of Lavon Paganini, MD.  I, Lavon Paganini, MD, have reviewed all documentation for this visit. The documentation on 12/30/20 for the exam, diagnosis, procedures, and orders are all accurate and complete.   Trenika Hudson, Dionne Bucy, MD, MPH [facility]

## 2020-12-31 LAB — LIPID PANEL
Chol/HDL Ratio: 4.3 ratio (ref 0.0–4.4)
Cholesterol, Total: 172 mg/dL (ref 100–199)
HDL: 40 mg/dL (ref >39)
LDL Chol Calc (NIH): 109 mg/dL — ABNORMAL HIGH (ref 0–99)
Triglycerides: 126 mg/dL (ref 0–149)
VLDL Cholesterol Cal: 23 mg/dL (ref 5–40)

## 2020-12-31 LAB — COMPREHENSIVE METABOLIC PANEL
ALT: 40 IU/L — ABNORMAL HIGH (ref 0–32)
AST: 28 IU/L (ref 0–40)
Albumin/Globulin Ratio: 1.6 (ref 1.2–2.2)
Albumin: 4 g/dL (ref 3.8–4.8)
Alkaline Phosphatase: 176 IU/L — ABNORMAL HIGH (ref 44–121)
BUN/Creatinine Ratio: 19 (ref 9–23)
BUN: 11 mg/dL (ref 6–24)
Bilirubin Total: 0.2 mg/dL (ref 0.0–1.2)
CO2: 21 mmol/L (ref 20–29)
Calcium: 9.4 mg/dL (ref 8.7–10.2)
Chloride: 102 mmol/L (ref 96–106)
Creatinine, Ser: 0.59 mg/dL (ref 0.57–1.00)
Globulin, Total: 2.5 g/dL (ref 1.5–4.5)
Glucose: 116 mg/dL — ABNORMAL HIGH (ref 65–99)
Potassium: 4.3 mmol/L (ref 3.5–5.2)
Sodium: 138 mmol/L (ref 134–144)
Total Protein: 6.5 g/dL (ref 6.0–8.5)
eGFR: 110 mL/min/{1.73_m2} (ref >59)

## 2021-01-08 ENCOUNTER — Other Ambulatory Visit: Payer: Self-pay | Admitting: Family Medicine

## 2021-01-08 DIAGNOSIS — I10 Essential (primary) hypertension: Secondary | ICD-10-CM

## 2021-01-08 NOTE — Telephone Encounter (Signed)
Requested Prescriptions  Pending Prescriptions Disp Refills  . losartan-hydrochlorothiazide (HYZAAR) 50-12.5 MG tablet [Pharmacy Med Name: LOSARTAN-HCTZ 50-12.5 MG TAB] 90 tablet 1    Sig: TAKE 1 TABLET BY MOUTH EVERY DAY     Cardiovascular: ARB + Diuretic Combos Passed - 01/08/2021  2:01 AM      Passed - K in normal range and within 180 days    Potassium  Date Value Ref Range Status  12/30/2020 4.3 3.5 - 5.2 mmol/L Final  11/17/2011 4.2 3.5 - 5.1 mmol/L Final         Passed - Na in normal range and within 180 days    Sodium  Date Value Ref Range Status  12/30/2020 138 134 - 144 mmol/L Final  11/17/2011 140 136 - 145 mmol/L Final         Passed - Cr in normal range and within 180 days    Creatinine  Date Value Ref Range Status  11/17/2011 0.68 0.60 - 1.30 mg/dL Final   Creatinine, Ser  Date Value Ref Range Status  12/30/2020 0.59 0.57 - 1.00 mg/dL Final         Passed - Ca in normal range and within 180 days    Calcium  Date Value Ref Range Status  12/30/2020 9.4 8.7 - 10.2 mg/dL Final   Calcium, Total  Date Value Ref Range Status  11/17/2011 8.8 8.5 - 10.1 mg/dL Final         Passed - Patient is not pregnant      Passed - Last BP in normal range    BP Readings from Last 1 Encounters:  12/30/20 119/81         Passed - Valid encounter within last 6 months    Recent Outpatient Visits          1 week ago Essential hypertension   [place], Dionne Bucy, MD   6 months ago Encounter for annual physical exam   [facility], Dionne Bucy, MD   10 months ago Essential hypertension   [facility] [place], Dionne Bucy, MD   1 year ago Possible urinary tract infection   [facility], Cle[place], [place]   1 year ago Essential hypertension   [place], Wendee Beavers, [place]      Future Appointments            In 6 months Bacigalupo, Dionne Bucy, MD [facility] [place]

## 2021-04-01 ENCOUNTER — Ambulatory Visit: Payer: Self-pay | Admitting: *Deleted

## 2021-04-01 NOTE — Telephone Encounter (Signed)
Pt calling stating that she believes that she may have covid. She states that she is not having severe symptoms and the pts husband tested positive for covid. She is requesting to have more info. Please advise.    Attempted to reach pt, left VM to call back.

## 2021-05-20 NOTE — Progress Notes (Signed)
°  ° ° °  Established patient visit   Patient: Hannah Roberson   DOB: [birth year]   50 y.o. Female  MRN: [id number] Visit Date: 05/21/2021  Today's healthcare provider: Lavon Paganini, MD   Chief Complaint  Patient presents with   Shoulder Pain   Subjective    Shoulder Pain  The pain is present in the left shoulder. This is a new problem. The current episode started 1 to 4 weeks ago (6 weeks ago). There has been no history of extremity trauma. The problem occurs constantly. The problem has been gradually worsening. Quality: only with movement 10/10. Pertinent negatives include no numbness, stiffness or tingling. Associated symptoms comments: Just with extension. The symptoms are aggravated by activity. She has tried cold and heat (Biofreeze) for the symptoms. The treatment provided no relief.    Pain with extension and abduction.  Feels tight  No known injury.  Never had this previously.  Medications: Outpatient Medications Prior to Visit  Medication Sig   albuterol (PROAIR HFA) 108 (90 Base) MCG/ACT inhaler Inhale 2 puffs into the lungs every 6 (six) hours as needed for wheezing or shortness of breath.   losartan-hydrochlorothiazide (HYZAAR) 50-12.5 MG tablet TAKE 1 TABLET BY MOUTH EVERY DAY   valACYclovir (VALTREX) 1000 MG tablet TAKE TWO TABLETS EVERY 12 HOURS FOR ONE DAY AS ONSET OF COLD SORE SYMPTOMS.   No facility-administered medications prior to visit.    Review of Systems  Musculoskeletal:  Negative for stiffness.  Neurological:  Negative for tingling and numbness.      Objective    BP 140/81 (BP Location: Right Arm, Patient Position: Sitting, Cuff Size: Large)    Pulse 76    Temp 98.5 F (36.9 C) (Oral)    Resp 16  {Show previous vital signs (optional):23777}  Physical Exam Vitals reviewed.  Constitutional:      General: She is not in acute distress.    Appearance: Normal appearance.  HENT:     Head: Normocephalic.  Pulmonary:     Effort: Pulmonary effort  is normal. No respiratory distress.  Musculoskeletal:     Comments: R shoulder : Normal ROM and strength L shoulder: Limited active and passive abduction, internal rotation, and extension. Rip strength intact. Sensation intact  Neurological:     Mental Status: She is alert and oriented to person, place, and time. Mental status is at baseline.      No results found for any visits on 05/21/21.  Assessment & Plan     1. Adhesive capsulitis of left shoulder - new problem - advised heat/ice, NSAIDs - referral to Ortho for further eval and management - Ambulatory referral to Orthopedic Surgery  2. Trapezius muscle spasm - ok to use flexeril prn   Meds ordered this encounter  Medications   cyclobenzaprine (FLEXERIL) 5 MG tablet    Sig: Take 1 tablet (5 mg total) by mouth 3 (three) times daily as needed for muscle spasms.    Dispense:  30 tablet    Refill:  0     Return if symptoms worsen or fail to improve.      I, Lavon Paganini, MD, have reviewed all documentation for this visit. The documentation on 05/21/21 for the exam, diagnosis, procedures, and orders are all accurate and complete.   Debe Anfinson, Dionne Bucy, MD, MPH [facility]

## 2021-05-21 ENCOUNTER — Ambulatory Visit: Payer: Managed Care, Other (non HMO) | Admitting: Family Medicine

## 2021-05-21 ENCOUNTER — Encounter: Payer: Self-pay | Admitting: Family Medicine

## 2021-05-21 ENCOUNTER — Other Ambulatory Visit: Payer: Self-pay

## 2021-05-21 VITALS — BP 140/81 | HR 76 | Temp 98.5°F | Resp 16

## 2021-05-21 DIAGNOSIS — M62838 Other muscle spasm: Secondary | ICD-10-CM

## 2021-05-21 DIAGNOSIS — M7502 Adhesive capsulitis of left shoulder: Secondary | ICD-10-CM

## 2021-05-21 MED ORDER — CYCLOBENZAPRINE HCL 5 MG PO TABS: 5.0000 mg | ORAL_TABLET | Freq: Three times a day (TID) | ORAL | 0 refills | Status: DC | PRN

## 2021-06-17 DIAGNOSIS — M25612 Stiffness of left shoulder, not elsewhere classified: Secondary | ICD-10-CM | POA: Insufficient documentation

## 2021-06-30 ENCOUNTER — Other Ambulatory Visit: Payer: Self-pay | Admitting: Obstetrics and Gynecology

## 2021-06-30 DIAGNOSIS — Z1231 Encounter for screening mammogram for malignant neoplasm of breast: Secondary | ICD-10-CM

## 2021-06-30 LAB — HM PAP SMEAR: HM Pap smear: NEGATIVE

## 2021-07-08 ENCOUNTER — Other Ambulatory Visit: Payer: Self-pay | Admitting: Family Medicine

## 2021-07-08 DIAGNOSIS — I10 Essential (primary) hypertension: Secondary | ICD-10-CM

## 2021-07-08 NOTE — Telephone Encounter (Signed)
Requested Prescriptions  Pending Prescriptions Disp Refills   losartan-hydrochlorothiazide (HYZAAR) 50-12.5 MG tablet [Pharmacy Med Name: LOSARTAN-HCTZ 50-12.5 MG TAB] 90 tablet 0    Sig: TAKE 1 TABLET BY MOUTH EVERY DAY     Cardiovascular: ARB + Diuretic Combos Failed - 07/08/2021  2:01 AM      Failed - K in normal range and within 180 days    Potassium  Date Value Ref Range Status  12/30/2020 4.3 3.5 - 5.2 mmol/L Final  11/17/2011 4.2 3.5 - 5.1 mmol/L Final         Failed - Na in normal range and within 180 days    Sodium  Date Value Ref Range Status  12/30/2020 138 134 - 144 mmol/L Final  11/17/2011 140 136 - 145 mmol/L Final         Failed - Cr in normal range and within 180 days    Creatinine  Date Value Ref Range Status  11/17/2011 0.68 0.60 - 1.30 mg/dL Final   Creatinine, Ser  Date Value Ref Range Status  12/30/2020 0.59 0.57 - 1.00 mg/dL Final         Failed - eGFR is 10 or above and within 180 days    EGFR (African American)  Date Value Ref Range Status  11/17/2011 >60  Final   GFR calc Af Amer  Date Value Ref Range Status  07/01/2020 125 >59 mL/min/1.73 Final    Comment:    **In accordance with recommendations from the NKF-ASN Task force,**   [facility] is in the process of updating its eGFR calculation to the   2021 CKD-EPI creatinine equation that estimates kidney function   without a race variable.    EGFR (Non-African Amer.)  Date Value Ref Range Status  11/17/2011 >60  Final    Comment:    eGFR values <81m/min/1.73 m2 may be an indication of chronic kidney disease (CKD). Calculated eGFR is useful in patients with stable renal function. The eGFR calculation will not be reliable in acutely ill patients when serum creatinine is changing rapidly. It is not useful in  patients on dialysis. The eGFR calculation may not be applicable to patients at the low and high extremes of body sizes, pregnant women, and vegetarians.    GFR calc non Af Amer  Date  Value Ref Range Status  07/01/2020 109 >59 mL/min/1.73 Final   eGFR  Date Value Ref Range Status  12/30/2020 110 >59 mL/min/1.73 Final         Failed - Last BP in normal range    BP Readings from Last 1 Encounters:  05/21/21 140/81         Passed - Patient is not pregnant      Passed - Valid encounter within last 6 months    Recent Outpatient Visits          1 month ago [facility]hes[facility] capsulitis of left shoulder   [facility]   6 months ago Essential hypertension   [facility] [facility]   1 year ago [facility] for annual physical exam   [facility]   1 year ago Essential hypertension   [place] [facility]   1 year ago [facility] urinary tract infection   [facility]     Future Appointments            In 1 week [facility] ADionne Bucy MD  [employer], [place]

## 2021-07-17 NOTE — Progress Notes (Signed)
Complete physical exam   Patient: Hannah Roberson   DOB: 01/29/1971   51 y.o. Female  MRN: [id number] Visit Date: 07/20/2021  Today's healthcare provider: Lavon Paganini, MD    I,Joseline E Rosas,acting as a scribe for Lavon Paganini, MD.,have documented all relevant documentation on the behalf of Lavon Paganini, MD,as directed by  Lavon Paganini, MD while in the presence of Lavon Paganini, MD.   Chief Complaint  Patient presents with   Annual Exam   Subjective    Hannah Roberson is a 51 y.o. female who presents today for a complete physical exam.  She reports consuming a low sodium and and two meals a day  diet. The patient does not participate in regular exercise at present. She generally feels well. She reports sleeping well. She does not have additional problems to discuss today.  HPI    Past Medical History:  Diagnosis Date   Allergic rhinitis    Asthma, extrinsic    Esophagitis, reflux    Past Surgical History:  Procedure Laterality Date   Bile duct obstruction     patient has had 2 surgeries on liver to obstruction    CHOLECYSTECTOMY     TUBAL LIGATION  1998   Social History   Socioeconomic History   Marital status: Married    Spouse name: Not on file   Number of children: Not on file   Years of education: Not on file   Highest education level: Not on file  Occupational History   Not on file  Tobacco Use   Smoking status: Former    Types: Cigarettes    Quit date: 09/27/2013    Years since quitting: 7.8   Smokeless tobacco: Never  Substance and Sexual Activity   Alcohol use: Yes    Alcohol/week: 0.0 standard drinks   Drug use: No   Sexual activity: Not on file  Other Topics Concern   Not on file  Social History Narrative   Not on file   Social Determinants of Health   Financial Resource Strain: Not on file  Food Insecurity: Not on file  Transportation Needs: Not on file  Physical Activity: Not on file  Stress: Not on  file  Social Connections: Not on file  Intimate Partner Violence: Not on file   Family Status  Relation Name Status   Mother  Alive   Father  Deceased at age 91       accident   Sister  39   Brother  Alive   MGF  Deceased   [place]  (Not Specified)   Family History  Problem Relation Age of Onset   Healthy Mother    Diabetes Sister    Diabetes Brother    Cancer Maternal Grandfather        lung cancer   Breast cancer Maternal Aunt    Allergies  Allergen Reactions   Metronidazole Hives and Shortness Of Breath    Other reaction(s): Angioedema Has previously used without reaction before. New onset allergy 06/2019. Given benadryl and inhaler    Patient Care Team: Virginia Crews, MD as PCP - General (Family Medicine)   Medications: Outpatient Medications Prior to Visit  Medication Sig   albuterol (PROAIR HFA) 108 (90 Base) MCG/ACT inhaler Inhale 2 puffs into the lungs every 6 (six) hours as needed for wheezing or shortness of breath.   losartan-hydrochlorothiazide (HYZAAR) 50-12.5 MG tablet TAKE 1 TABLET BY MOUTH EVERY DAY   [DISCONTINUED] cyclobenzaprine (FLEXERIL) 5  MG tablet Take 1 tablet (5 mg total) by mouth 3 (three) times daily as needed for muscle spasms. (Patient not taking: Reported on 07/20/2021)   [DISCONTINUED] valACYclovir (VALTREX) 1000 MG tablet TAKE TWO TABLETS EVERY 12 HOURS FOR ONE DAY AS ONSET OF COLD SORE SYMPTOMS. (Patient not taking: Reported on 07/20/2021)   No facility-administered medications prior to visit.    Review of Systems  Constitutional: Negative.   HENT: Negative.    Eyes: Negative.   Respiratory: Negative.    Cardiovascular: Negative.   Gastrointestinal: Negative.   Endocrine: Negative.   Genitourinary: Negative.   Musculoskeletal: Negative.   Skin: Negative.   Allergic/Immunologic: Negative.   Neurological: Negative.   Hematological: Negative.   Psychiatric/Behavioral: Negative.       Objective    BP 114/80 (BP  Location: Left Arm, Patient Position: Sitting, Cuff Size: Large)    Pulse 67    Temp 97.7 F (36.5 C) (Oral)    Resp 16    Ht 5\' 2"  (1.575 m)    Wt 243 lb (110.2 kg)    BMI 44.45 kg/m    Physical Exam Vitals reviewed.  Constitutional:      General: She is not in acute distress.    Appearance: Normal appearance. She is well-developed. She is not diaphoretic.  HENT:     Head: Normocephalic and atraumatic.     Right Ear: Tympanic membrane, ear canal and external ear normal.     Left Ear: Tympanic membrane, ear canal and external ear normal.     Nose: Nose normal.     Mouth/Throat:     Mouth: Mucous membranes are moist.     Pharynx: Oropharynx is clear. No oropharyngeal exudate.  Eyes:     General: No scleral icterus.    Conjunctiva/sclera: Conjunctivae normal.     Pupils: Pupils are equal, round, and reactive to light.  Neck:     Thyroid: No thyromegaly.  Cardiovascular:     Rate and Rhythm: Normal rate and regular rhythm.     Pulses: Normal pulses.     Heart sounds: Normal heart sounds. No murmur heard. Pulmonary:     Effort: Pulmonary effort is normal. No respiratory distress.     Breath sounds: Normal breath sounds. No wheezing or rales.  Abdominal:     General: There is no distension.     Palpations: Abdomen is soft.     Tenderness: There is no abdominal tenderness.  Musculoskeletal:        General: No deformity.     Cervical back: Neck supple.     Right lower leg: No edema.     Left lower leg: No edema.  Lymphadenopathy:     Cervical: No cervical adenopathy.  Skin:    General: Skin is warm and dry.     Findings: No rash.  Neurological:     Mental Status: She is alert and oriented to person, place, and time. Mental status is at baseline.     Sensory: No sensory deficit.     Motor: No weakness.     Gait: Gait normal.  Psychiatric:        Mood and Affect: Mood normal.        Behavior: Behavior normal.        Thought Content: Thought content normal.      Last  depression screening scores PHQ 2/9 Scores 07/20/2021 07/01/2020 02/25/2020  PHQ - 2 Score 0 0 0  PHQ- 9 Score - 4 0   Last  fall risk screening Fall Risk  07/01/2020  Falls in the past year? 0  Number falls in past yr: 0  Injury with Fall? 0  Risk for fall due to : No Fall Risks  Follow up Falls evaluation completed   Last Audit-C alcohol use screening Alcohol Use Disorder Test (AUDIT) 07/20/2021  1. How often do you have a drink containing alcohol? 0  2. How many drinks containing alcohol do you have on a typical day when you are drinking? 0  3. How often do you have six or more drinks on one occasion? 0  AUDIT-C Score 0  Alcohol Brief Interventions/Follow-up -   A score of 3 or more in women, and 4 or more in men indicates increased risk for alcohol abuse, EXCEPT if all of the points are from question 1   No results found for any visits on 07/20/21.  Assessment & Plan    Routine Health Maintenance and Physical Exam  Exercise Activities and Dietary recommendations  Goals   None     Immunization History  Administered Date(s) Administered   Hep A / Hep B 10/15/2004   Td 07/01/2020    Health Maintenance  Topic Date Due   COVID-19 Vaccine (1) Never done   COLONOSCOPY (Pts 45-70yrs Insurance coverage will need to be confirmed)  Never done   Zoster Vaccines- Shingrix (1 of 2) Never done   INFLUENZA VACCINE  08/28/2021 (Originally 12/29/2020)   PAP SMEAR-Modifier  01/04/2022   MAMMOGRAM  04/30/2022   TETANUS/TDAP  07/01/2030   Hepatitis C Screening  Completed   HIV Screening  Completed   HPV VACCINES  Aged Out    Discussed health benefits of physical activity, and encouraged her to engage in regular exercise appropriate for her age and condition.  Problem List Items Addressed This Visit       Cardiovascular and Mediastinum   Essential hypertension    Well controlled Continue current medications Recheck metabolic panel F/u in 6 months       Relevant Orders    Comprehensive metabolic panel     Other   Morbid obesity ([place])    Discussed importance of healthy weight management Discussed diet and exercise  Agrees to Healthy Weight and Wellness      Relevant Orders   Amb Ref to Medical Weight Management   Moderate mixed hyperlipidemia not requiring statin therapy    Reviewed last lipid panel Not currently on a statin Recheck FLP and CMP Discussed diet and exercise       Relevant Orders   Lipid Panel With LDL/HDL Ratio   Other Visit Diagnoses     Encounter for annual physical exam    -  Primary   Relevant Orders   Lipid Panel With LDL/HDL Ratio   Comprehensive metabolic panel   Hemoglobin A1c   HIV antibody (with reflex)   Colon cancer screening       Relevant Orders   Ambulatory referral to Gastroenterology   Hyperglycemia       Relevant Orders   Hemoglobin A1c   Encounter for screening for HIV       Relevant Orders   HIV antibody (with reflex)   BMI 40.0-44.9, adult ([place])       Relevant Orders   Amb Ref to Medical Weight Management        Return in about 6 months (around 01/17/2022) for chronic disease f/u.     I, Lavon Paganini, MD, have reviewed all documentation for this visit.  The documentation on 07/20/21 for the exam, diagnosis, procedures, and orders are all accurate and complete.   Sherman Donaldson, Dionne Bucy, MD, MPH [facility]

## 2021-07-20 ENCOUNTER — Other Ambulatory Visit: Payer: Self-pay

## 2021-07-20 ENCOUNTER — Ambulatory Visit (INDEPENDENT_AMBULATORY_CARE_PROVIDER_SITE_OTHER): Payer: No Typology Code available for payment source | Admitting: Family Medicine

## 2021-07-20 ENCOUNTER — Encounter: Payer: Self-pay | Admitting: Family Medicine

## 2021-07-20 VITALS — BP 114/80 | HR 67 | Temp 97.7°F | Resp 16 | Ht 62.0 in | Wt 243.0 lb

## 2021-07-20 DIAGNOSIS — Z6841 Body Mass Index (BMI) 40.0 and over, adult: Secondary | ICD-10-CM

## 2021-07-20 DIAGNOSIS — Z0000 Encounter for general adult medical examination without abnormal findings: Secondary | ICD-10-CM | POA: Diagnosis not present

## 2021-07-20 DIAGNOSIS — I10 Essential (primary) hypertension: Secondary | ICD-10-CM | POA: Diagnosis not present

## 2021-07-20 DIAGNOSIS — Z1211 Encounter for screening for malignant neoplasm of colon: Secondary | ICD-10-CM | POA: Diagnosis not present

## 2021-07-20 DIAGNOSIS — Z114 Encounter for screening for human immunodeficiency virus [HIV]: Secondary | ICD-10-CM

## 2021-07-20 DIAGNOSIS — R739 Hyperglycemia, unspecified: Secondary | ICD-10-CM

## 2021-07-20 DIAGNOSIS — E782 Mixed hyperlipidemia: Secondary | ICD-10-CM

## 2021-07-20 NOTE — Assessment & Plan Note (Signed)
Well controlled Continue current medications Recheck metabolic panel F/u in 6 months  

## 2021-07-20 NOTE — Assessment & Plan Note (Signed)
Discussed importance of healthy weight management Discussed diet and exercise  Agrees to Healthy Weight and Wellness

## 2021-07-20 NOTE — Patient Instructions (Signed)
   The CDC recommends two doses of Shingrix (the shingles vaccine) separated by 2 to 6 months for adults age 50 years and older. I recommend checking with your insurance plan regarding coverage for this vaccine.   

## 2021-07-20 NOTE — Assessment & Plan Note (Signed)
Reviewed last lipid panel Not currently on a statin Recheck FLP and CMP Discussed diet and exercise  

## 2021-07-21 ENCOUNTER — Other Ambulatory Visit: Payer: Self-pay

## 2021-07-21 DIAGNOSIS — Z1211 Encounter for screening for malignant neoplasm of colon: Secondary | ICD-10-CM

## 2021-07-21 LAB — COMPREHENSIVE METABOLIC PANEL
ALT: 87 IU/L — ABNORMAL HIGH (ref 0–32)
AST: 79 IU/L — ABNORMAL HIGH (ref 0–40)
Albumin/Globulin Ratio: 1.8 (ref 1.2–2.2)
Albumin: 4.2 g/dL (ref 3.8–4.8)
Alkaline Phosphatase: 195 IU/L — ABNORMAL HIGH (ref 44–121)
BUN/Creatinine Ratio: 16 (ref 9–23)
BUN: 9 mg/dL (ref 6–24)
Bilirubin Total: 0.4 mg/dL (ref 0.0–1.2)
CO2: 21 mmol/L (ref 20–29)
Calcium: 9.5 mg/dL (ref 8.7–10.2)
Chloride: 100 mmol/L (ref 96–106)
Creatinine, Ser: 0.55 mg/dL — ABNORMAL LOW (ref 0.57–1.00)
Globulin, Total: 2.3 g/dL (ref 1.5–4.5)
Glucose: 160 mg/dL — ABNORMAL HIGH (ref 70–99)
Potassium: 4.4 mmol/L (ref 3.5–5.2)
Sodium: 139 mmol/L (ref 134–144)
Total Protein: 6.5 g/dL (ref 6.0–8.5)
eGFR: 112 mL/min/{1.73_m2} (ref >59)

## 2021-07-21 LAB — HEMOGLOBIN A1C
Est. average glucose Bld gHb Est-mCnc: 160 mg/dL
Hgb A1c MFr Bld: 7.2 % — ABNORMAL HIGH (ref 4.8–5.6)

## 2021-07-21 LAB — LIPID PANEL WITH LDL/HDL RATIO
Cholesterol, Total: 188 mg/dL (ref 100–199)
HDL: 47 mg/dL (ref >39)
LDL Chol Calc (NIH): 114 mg/dL — ABNORMAL HIGH (ref 0–99)
LDL/HDL Ratio: 2.4 ratio (ref 0.0–3.2)
Triglycerides: 150 mg/dL — ABNORMAL HIGH (ref 0–149)
VLDL Cholesterol Cal: 27 mg/dL (ref 5–40)

## 2021-07-21 LAB — HIV ANTIBODY (ROUTINE TESTING W REFLEX): HIV Screen 4th Generation wRfx: NONREACTIVE

## 2021-07-21 MED ORDER — NA SULFATE-K SULFATE-MG SULF 17.5-3.13-1.6 GM/177ML PO SOLN: 1.0000 | Freq: Once | ORAL | 0 refills | Status: AC

## 2021-07-21 NOTE — Progress Notes (Signed)
Gastroenterology Pre-Procedure Review  Request Date: 08/14/2021 Requesting Physician: Dr. Allen Norris  PATIENT REVIEW QUESTIONS: The patient responded to the following health history questions as indicated:    1. Are you having any GI issues? no 2. Do you have a personal history of Polyps?  Unsure, last colonoscopy was 5 years ago. 3. Do you have a family history of Colon Cancer or Polyps? no 4. Diabetes Mellitus? no 5. Joint replacements in the past 12 months?no 6. Major health problems in the past 3 months?no 7. Any artificial heart valves, MVP, or defibrillator?no    MEDICATIONS & ALLERGIES:    Patient reports the following regarding taking any anticoagulation/antiplatelet therapy:   Plavix, Coumadin, Eliquis, Xarelto, Lovenox, Pradaxa, Brilinta, or Effient? no Aspirin? no  Patient confirms/reports the following medications:  Current Outpatient Medications  Medication Sig Dispense Refill   albuterol (PROAIR HFA) 108 (90 Base) MCG/ACT inhaler Inhale 2 puffs into the lungs every 6 (six) hours as needed for wheezing or shortness of breath. 6.7 g 0   amoxicillin-clavulanate (AUGMENTIN) 875-125 MG tablet Take 1 tablet by mouth 2 (two) times daily.     esomeprazole (NEXIUM) 40 MG capsule Take 40 mg by mouth.     fluconazole (DIFLUCAN) 150 MG tablet Take 150 mg by mouth once.     losartan-hydrochlorothiazide (HYZAAR) 50-12.5 MG tablet TAKE 1 TABLET BY MOUTH EVERY DAY 90 tablet 0   meloxicam (MOBIC) 7.5 MG tablet Take 7.5 mg by mouth 2 (two) times daily.     No current facility-administered medications for this visit.    Patient confirms/reports the following allergies:  Allergies  Allergen Reactions   Metronidazole Hives and Shortness Of Breath    Other reaction(s): Angioedema Has previously used without reaction before. New onset allergy 06/2019. Given benadryl and inhaler    No orders of the defined types were placed in this encounter.   AUTHORIZATION INFORMATION Primary  Insurance: 1D#: Group #:  Secondary Insurance: 1D#: Group #:  SCHEDULE INFORMATION: Date: 08/14/2021 Time: Location: [place]

## 2021-07-24 ENCOUNTER — Ambulatory Visit: Payer: No Typology Code available for payment source | Admitting: Family Medicine

## 2021-07-31 ENCOUNTER — Ambulatory Visit: Payer: No Typology Code available for payment source | Admitting: Family Medicine

## 2021-08-05 ENCOUNTER — Encounter: Payer: Self-pay | Admitting: Gastroenterology

## 2021-08-05 ENCOUNTER — Other Ambulatory Visit: Payer: Self-pay

## 2021-08-11 ENCOUNTER — Ambulatory Visit
Admission: RE | Admit: 2021-08-11 | Discharge: 2021-08-11 | Disposition: A | Discharge status: Home / Self Care | Payer: No Typology Code available for payment source | Source: Ambulatory Visit | Attending: Obstetrics and Gynecology | Admitting: Obstetrics and Gynecology

## 2021-08-11 ENCOUNTER — Other Ambulatory Visit: Payer: Self-pay

## 2021-08-11 DIAGNOSIS — Z1231 Encounter for screening mammogram for malignant neoplasm of breast: Secondary | ICD-10-CM | POA: Insufficient documentation

## 2021-08-14 ENCOUNTER — Ambulatory Visit: Payer: No Typology Code available for payment source | Admitting: Anesthesiology

## 2021-08-14 ENCOUNTER — Encounter: Payer: Self-pay | Admitting: Gastroenterology

## 2021-08-14 ENCOUNTER — Other Ambulatory Visit: Payer: Self-pay

## 2021-08-14 ENCOUNTER — Encounter
Admission: RE | Disposition: A | Discharge status: Home / Self Care | Payer: Self-pay | Source: Ambulatory Visit | Attending: Gastroenterology

## 2021-08-14 ENCOUNTER — Ambulatory Visit
Admission: RE | Admit: 2021-08-14 | Discharge: 2021-08-14 | Disposition: A | Discharge status: Home / Self Care | Payer: No Typology Code available for payment source | Source: Ambulatory Visit | Attending: Gastroenterology | Admitting: Gastroenterology

## 2021-08-14 DIAGNOSIS — Z1211 Encounter for screening for malignant neoplasm of colon: Secondary | ICD-10-CM | POA: Diagnosis not present

## 2021-08-14 DIAGNOSIS — K635 Polyp of colon: Secondary | ICD-10-CM

## 2021-08-14 DIAGNOSIS — I10 Essential (primary) hypertension: Secondary | ICD-10-CM | POA: Diagnosis not present

## 2021-08-14 DIAGNOSIS — D120 Benign neoplasm of cecum: Secondary | ICD-10-CM | POA: Insufficient documentation

## 2021-08-14 DIAGNOSIS — Z87891 Personal history of nicotine dependence: Secondary | ICD-10-CM | POA: Insufficient documentation

## 2021-08-14 DIAGNOSIS — J45909 Unspecified asthma, uncomplicated: Secondary | ICD-10-CM | POA: Diagnosis not present

## 2021-08-14 DIAGNOSIS — K219 Gastro-esophageal reflux disease without esophagitis: Secondary | ICD-10-CM | POA: Diagnosis not present

## 2021-08-14 HISTORY — PX: POLYPECTOMY: SHX5525

## 2021-08-14 HISTORY — PX: COLONOSCOPY WITH PROPOFOL: SHX5780

## 2021-08-14 HISTORY — DX: Essential (primary) hypertension: I10

## 2021-08-14 HISTORY — DX: Gastro-esophageal reflux disease without esophagitis: K21.9

## 2021-08-14 LAB — POCT PREGNANCY, URINE: Preg Test, Ur: NEGATIVE

## 2021-08-14 SURGERY — COLONOSCOPY WITH PROPOFOL
Anesthesia: General | Site: Rectum

## 2021-08-14 MED ORDER — ONDANSETRON HCL 4 MG/2ML IJ SOLN: 4.0000 mg | Freq: Once | INTRAMUSCULAR | Status: DC | PRN

## 2021-08-14 MED ORDER — SODIUM CHLORIDE 0.9 % IV SOLN: INTRAVENOUS | Status: DC

## 2021-08-14 MED ORDER — ACETAMINOPHEN 10 MG/ML IV SOLN: 1000.0000 mg | Freq: Once | INTRAVENOUS | Status: DC | PRN

## 2021-08-14 MED ORDER — STERILE WATER FOR IRRIGATION IR SOLN
Status: DC | PRN
  Administered 2021-08-14: .05 mL

## 2021-08-14 MED ORDER — LACTATED RINGERS IV SOLN: INTRAVENOUS | Status: DC

## 2021-08-14 MED ORDER — LIDOCAINE HCL (CARDIAC) PF 100 MG/5ML IV SOSY
PREFILLED_SYRINGE | INTRAVENOUS | Status: DC | PRN
  Administered 2021-08-14: 50 mg via INTRAVENOUS

## 2021-08-14 MED ORDER — STERILE WATER FOR IRRIGATION IR SOLN
Status: DC | PRN
  Administered 2021-08-14: 250 mL

## 2021-08-14 MED ORDER — PROPOFOL 10 MG/ML IV BOLUS
INTRAVENOUS | Status: DC | PRN
  Administered 2021-08-14: 30 mg via INTRAVENOUS
  Administered 2021-08-14: 40 mg via INTRAVENOUS
  Administered 2021-08-14: 80 mg via INTRAVENOUS
  Administered 2021-08-14: 40 mg via INTRAVENOUS
  Administered 2021-08-14: 30 mg via INTRAVENOUS
  Administered 2021-08-14: 20 mg via INTRAVENOUS
  Administered 2021-08-14: 40 mg via INTRAVENOUS
  Administered 2021-08-14: 30 mg via INTRAVENOUS

## 2021-08-14 SURGICAL SUPPLY — 8 items
GOWN CVR UNV OPN BCK APRN NK (MISCELLANEOUS) ×4 IMPLANT
GOWN ISOL THUMB LOOP REG UNIV (MISCELLANEOUS) ×6
KIT PRC NS LF DISP ENDO (KITS) ×2 IMPLANT
KIT PROCEDURE OLYMPUS (KITS) ×3
MANIFOLD NEPTUNE II (INSTRUMENTS) ×3 IMPLANT
SNARE COLD EXACTO (MISCELLANEOUS) ×1 IMPLANT
TRAP ETRAP POLY (MISCELLANEOUS) ×1 IMPLANT
WATER STERILE IRR 250ML POUR (IV SOLUTION) ×3 IMPLANT

## 2021-08-14 NOTE — H&P (Signed)
? ?Hannah Lame, MD [facility] ?[address] ?[place], [place] [address] ?Phone: [phone] ?Fax : [phone] ? ?Primary Care Physician:  Virginia Crews, MD ?Primary Gastroenterologist:  Dr. Allen Norris ? ?Pre-Procedure History & Physical: ?HPI:  Hannah Roberson is a 51 y.o. female is here for a screening colonoscopy.  ? ?Past Medical History:  ?Diagnosis Date  ? Allergic rhinitis   ? Asthma, extrinsic   ? inhaler but not needed often  ? Esophagitis, reflux   ? GERD (gastroesophageal reflux disease)   ? Hypertension   ? ? ?Past Surgical History:  ?Procedure Laterality Date  ? Bile duct obstruction    ? patient has had 2 surgeries on liver to obstruction   ? CHOLECYSTECTOMY    ? TUBAL LIGATION  1998  ? ? ?Prior to Admission medications   ?Medication Sig Start Date End Date Taking? Authorizing Provider  ?albuterol (PROAIR HFA) 108 (90 Base) MCG/ACT inhaler Inhale 2 puffs into the lungs every 6 (six) hours as needed for wheezing or shortness of breath. 07/11/19  Yes Trinna Post, PA-C  ?esomeprazole (NEXIUM) 40 MG capsule Take 40 mg by mouth as needed.   Yes [provider]  ?losartan-hydrochlorothiazide (HYZAAR) 50-12.5 MG tablet TAKE 1 TABLET BY MOUTH EVERY DAY 07/08/21  Yes Bacigalupo, Dionne Bucy, MD  ?amoxicillin-clavulanate (AUGMENTIN) 875-125 MG tablet Take 1 tablet by mouth 2 (two) times daily. ?Patient not taking: Reported on 08/05/2021 06/10/21   [provider]  ?fluconazole (DIFLUCAN) 150 MG tablet Take 150 mg by mouth once. ?Patient not taking: Reported on 08/05/2021 06/10/21   [provider]  ?meloxicam (MOBIC) 7.5 MG tablet Take 7.5 mg by mouth 2 (two) times daily. ?Patient not taking: Reported on 08/05/2021 06/25/21   [provider]  ? ? ?Allergies as of 07/21/2021 - Review Complete 07/20/2021  ?Allergen Reaction Noted  ? Metronidazole Hives and Shortness Of Breath 06/20/2019  ? ? ?Family History  ?Problem Relation Age of Onset  ? Healthy Mother   ? Diabetes Sister    ? Diabetes Brother   ? Cancer Maternal Grandfather   ?     lung cancer  ? Breast cancer Maternal Aunt   ? ? ?Social History  ? ?Socioeconomic History  ? Marital status: Married  ?  Spouse name: Not on file  ? Number of children: Not on file  ? Years of education: Not on file  ? Highest education level: Not on file  ?Occupational History  ? Not on file  ?Tobacco Use  ? Smoking status: Former  ?  Packs/day: 1.00  ?  Years: 10.00  ?  Pack years: 10.00  ?  Types: Cigarettes  ?  Quit date: 09/27/2013  ?  Years since quitting: 7.8  ? Smokeless tobacco: Never  ?Substance and Sexual Activity  ? Alcohol use: Not Currently  ? Drug use: No  ? Sexual activity: Not on file  ?Other Topics Concern  ? Not on file  ?Social History Narrative  ? Not on file  ? ?Social Determinants of Health  ? ?Financial Resource Strain: Not on file  ?Food Insecurity: Not on file  ?Transportation Needs: Not on file  ?Physical Activity: Not on file  ?Stress: Not on file  ?Social Connections: Not on file  ?Intimate Partner Violence: Not on file  ? ? ?Review of Systems: ?See HPI, otherwise negative ROS ? ?Physical Exam: ?Ht '5\' 2"'$  (1.575 m)   Wt 105.7 kg   LMP 08/04/2021   BMI 42.62 kg/m?  ?  General:   Alert,  pleasant and cooperative in NAD ?Head:  Normocephalic and atraumatic. ?Neck:  Supple; no masses or thyromegaly. ?Lungs:  Clear throughout to auscultation.    ?Heart:  Regular rate and rhythm. ?Abdomen:  Soft, nontender and nondistended. Normal bowel sounds, without guarding, and without rebound.   ?Neurologic:  Alert and  oriented x4;  grossly normal neurologically. ? ?Impression/Plan: ?Hannah Roberson is now here to undergo a screening colonoscopy. ? ?Risks, benefits, and alternatives regarding colonoscopy have been reviewed with the patient.  Questions have been answered.  All parties agreeable. ?

## 2021-08-14 NOTE — Transfer of Care (Signed)
Immediate Anesthesia Transfer of Care Note ? ?Patient: Hannah Roberson ? ?Procedure(s) Performed: COLONOSCOPY WITH PROPOFOL (Rectum) ?POLYPECTOMY (Rectum) ? ?Patient Location: PACU ? ?Anesthesia Type: General ? ?Level of Consciousness: awake, alert  and patient cooperative ? ?Airway and Oxygen Therapy: Patient Spontanous Breathing and Patient connected to supplemental oxygen ? ?Post-op Assessment: Post-op Vital signs reviewed, Patient's Cardiovascular Status Stable, Respiratory Function Stable, Patent Airway and No signs of Nausea or vomiting ? ?Post-op Vital Signs: Reviewed and stable ? ?Complications: No notable events documented. ? ?

## 2021-08-14 NOTE — Anesthesia Procedure Notes (Signed)
Date/Time: 08/14/2021 10:11 AM ?Performed by: Mayme Genta, CRNA ?Pre-anesthesia Checklist: Patient identified, Emergency Drugs available, Suction available, Timeout performed and Patient being monitored ?Patient Re-evaluated:Patient Re-evaluated prior to induction ?Oxygen Delivery Method: Nasal cannula ?Placement Confirmation: positive ETCO2 ? ? ? ? ?

## 2021-08-14 NOTE — Anesthesia Postprocedure Evaluation (Signed)
Anesthesia Post Note ? ?Patient: Hannah Roberson ? ?Procedure(s) Performed: COLONOSCOPY WITH PROPOFOL (Rectum) ?POLYPECTOMY (Rectum) ? ? ?  ?Patient location during evaluation: PACU ?Anesthesia Type: General ?Level of consciousness: awake and alert ?Pain management: pain level controlled ?Vital Signs Assessment: post-procedure vital signs reviewed and stable ?Respiratory status: spontaneous breathing, nonlabored ventilation, respiratory function stable and patient connected to nasal cannula oxygen ?Cardiovascular status: blood pressure returned to baseline and stable ?Postop Assessment: no apparent nausea or vomiting ?Anesthetic complications: no ? ? ?No notable events documented. ? ?Winna Golla A  Reyn Faivre ? ? ? ? ? ?

## 2021-08-14 NOTE — Op Note (Signed)
[facility] ?Gastroenterology ?Patient Name: Hannah Roberson ?Procedure Date: 08/14/2021 10:06 AM ?MRN: [id number] ?Account #: [id number] ?Date of Birth: 11-15-1970 ?Admit Type: Outpatient ?Age: 51 ?Room: [facility] OR ROOM 01 ?Gender: Female ?Note Status: Finalized ?Instrument Name: [id number] ?Procedure:             Colonoscopy ?Indications:           Screening for colorectal malignant neoplasm ?Providers:             Lucilla Lame MD, MD ?Referring MD:          Dionne Bucy. Bacigalupo (Referring MD) ?Medicines:             Propofol per Anesthesia ?Complications:         No immediate complications. ?Procedure:             Pre-Anesthesia Assessment: ?                       - Prior to the procedure, a History and Physical was  ?                       performed, and patient medications and allergies were  ?                       reviewed. The patient's tolerance of previous  ?                       anesthesia was also reviewed. The risks and benefits  ?                       of the procedure and the sedation options and risks  ?                       were discussed with the patient. All questions were  ?                       answered, and informed consent was obtained. Prior  ?                       Anticoagulants: The patient has taken no previous  ?                       anticoagulant or antiplatelet agents. ASA Grade  ?                       Assessment: II - A patient with mild systemic disease.  ?                       After reviewing the risks and benefits, the patient  ?                       was deemed in satisfactory condition to undergo the  ?                       procedure. ?                       After obtaining informed consent, the colonoscope was  ?  passed under direct vision. Throughout the procedure,  ?                       the patient's blood pressure, pulse, and oxygen  ?                       saturations were monitored continuously. The  ?                        Colonoscope was introduced through the anus and  ?                       advanced to the the cecum, identified by appendiceal  ?                       orifice and ileocecal valve. The colonoscopy was  ?                       performed without difficulty. The patient tolerated  ?                       the procedure well. The quality of the bowel  ?                       preparation was adequate to identify polyps. ?Findings: ?     The perianal and digital rectal examinations were normal. ?     A 5 mm polyp was found in the cecum. The polyp was sessile. The polyp  ?     was removed with a cold snare. Resection and retrieval were complete. ?Impression:            - One 5 mm polyp in the cecum, removed with a cold  ?                       snare. Resected and retrieved. ?Recommendation:        - Discharge patient to home. ?                       - Resume previous diet. ?                       - Continue present medications. ?                       - Await pathology results. ?                       - If the pathology report reveals adenomatous tissue,  ?                       then repeat the colonoscopy for surveillance in 5  ?                       years. ?Procedure Code(s):     --- Professional --- ?                       [id number], Colonoscopy, flexible; with removal of  ?  tumor(s), polyp(s), or other lesion(s) by snare  ?                       technique ?Diagnosis Code(s):     --- Professional --- ?                       Z12.11, Encounter for screening for malignant neoplasm  ?                       of colon ?                       K63.5, Polyp of colon ?CPT copyright 2019 American Medical Association. All rights reserved. ?The codes documented in this report are preliminary and upon coder review may  ?be revised to meet current compliance requirements. ?Lucilla Lame MD, MD ?08/14/2021 10:25:11 AM ?This report has been signed electronically. ?Number of Addenda: 0 ?Note Initiated On: 08/14/2021 10:06  AM ?Scope Withdrawal Time: 0 hours 7 minutes 30 seconds  ?Total Procedure Duration: 0 hours 9 minutes 25 seconds  ?Estimated Blood Loss:  Estimated blood loss: none. ?     [facility] ?

## 2021-08-14 NOTE — Anesthesia Preprocedure Evaluation (Signed)
Anesthesia Evaluation  ?Patient identified by MRN, date of birth, ID band ?Patient awake ? ? ? ?Reviewed: ?Allergy & Precautions, NPO status , Patient's Chart, lab work & pertinent test results, reviewed documented beta blocker date and time  ? ?History of Anesthesia Complications ?Negative for: history of anesthetic complications ? ?Airway ?Mallampati: III ? ?TM Distance: >3 FB ?Neck ROM: Limited ? ? ? Dental ?  ?Pulmonary ?asthma , former smoker,  ?  ?breath sounds clear to auscultation ? ? ? ? ? ? Cardiovascular ?hypertension, (-) angina(-) DOE  ?Rhythm:Regular Rate:Normal ? ? ?HLD ?  ?Neuro/Psych ?PSYCHIATRIC DISORDERS Anxiety   ? GI/Hepatic ?GERD  ,  ?Endo/Other  ?diabetes ? Renal/GU ?  ? ?  ?Musculoskeletal ? ? Abdominal ?(+) + obese (BMI 42),   ?Peds ? Hematology ?  ?Anesthesia Other Findings ? ? Reproductive/Obstetrics ? ?  ? ? ? ? ? ? ? ? ? ? ? ? ? ?  ?  ? ? ? ? ? ? ? ?Anesthesia Physical ?Anesthesia Plan ? ?ASA: 3 ? ?Anesthesia Plan: General  ? ?Post-op Pain Management:   ? ?Induction: Intravenous ? ?PONV Risk Score and Plan: 3 and Propofol infusion, TIVA and Treatment may vary due to age or medical condition ? ?Airway Management Planned: Natural Airway and Nasal Cannula ? ?Additional Equipment:  ? ?Intra-op Plan:  ? ?Post-operative Plan:  ? ?Informed Consent: I have reviewed the patients History and Physical, chart, labs and discussed the procedure including the risks, benefits and alternatives for the proposed anesthesia with the patient or authorized representative who has indicated his/her understanding and acceptance.  ? ? ? ? ? ?Plan Discussed with: CRNA and Anesthesiologist ? ?Anesthesia Plan Comments:   ? ? ? ? ? ?Anesthesia Quick Evaluation ? ?

## 2021-08-17 ENCOUNTER — Encounter: Payer: Self-pay | Admitting: Gastroenterology

## 2021-08-17 LAB — SURGICAL PATHOLOGY

## 2021-08-28 ENCOUNTER — Other Ambulatory Visit: Payer: Self-pay | Admitting: Family Medicine

## 2021-08-28 ENCOUNTER — Telehealth: Payer: Self-pay | Admitting: Family Medicine

## 2021-08-28 DIAGNOSIS — B001 Herpesviral vesicular dermatitis: Secondary | ICD-10-CM

## 2021-08-28 DIAGNOSIS — J4520 Mild intermittent asthma, uncomplicated: Secondary | ICD-10-CM

## 2021-08-28 MED ORDER — VALACYCLOVIR HCL 1 G PO TABS: 1000.0000 mg | ORAL_TABLET | Freq: Two times a day (BID) | ORAL | 0 refills | Status: AC

## 2021-08-28 NOTE — Telephone Encounter (Signed)
Patient advised.KW 

## 2021-08-28 NOTE — Telephone Encounter (Signed)
Medication Refill - Medication:  ?albuterol (PROAIR HFA) 108 (90 Base) MCG/ACT inhaler  ? ?Has the patient contacted their pharmacy? Yes.   ?Contact PCP- old one is expired ? ?Preferred Pharmacy (with phone number or street name):  ?[id number] [place] - [address]  [place] [place] ?Phone:  [phone] Fax:  [phone] ? ?Has the patient been seen for an appointment in the last year OR does the patient have an upcoming appointment? Yes.   ? ?Agent: Please be advised that RX refills may take up to 3 business days. We ask that you follow-up with your pharmacy. ?

## 2021-08-28 NOTE — Telephone Encounter (Signed)
Copied from [place] [phone]. Topic: Quick Communication - Rx Refill/Question ?>> Aug 28, 2021  9:40 AM Leward Quan A wrote: ?Medication: valACYclovir (VALTREX) 1000 MG tablet  ? ?Has the patient contacted their pharmacy? Yes.  Nothing sent over  ?(Agent: If no, request that the patient contact the pharmacy for the refill. If patient does not wish to contact the pharmacy document the reason why and proceed with request.) ?(Agent: If yes, when and what did the pharmacy advise?) ? ?Preferred Pharmacy (with phone number or street name): [id number] [place] [address]  ?Phone:  [phone]?Fax:  [phone]? ? ? ?Has the patient been seen for an appointment in the last year OR does the patient have an upcoming appointment? Yes.   ? ?Agent: Please be advised that RX refills may take up to 3 business days. We ask that you follow-up with your pharmacy. ?

## 2021-08-28 NOTE — Telephone Encounter (Signed)
[facility] nurse contacted office stating that patient has called our office 4x today to get prescription for Valtrex filled and stated that this was a urgent matter. Please review request. KW ?

## 2021-08-31 MED ORDER — ALBUTEROL SULFATE HFA 108 (90 BASE) MCG/ACT IN AERS: 2.0000 | INHALATION_SPRAY | Freq: Four times a day (QID) | RESPIRATORY_TRACT | 1 refills | Status: DC | PRN

## 2021-08-31 NOTE — Telephone Encounter (Signed)
Requested Prescriptions  ?Pending Prescriptions Disp Refills  ?? albuterol (PROAIR HFA) 108 (90 Base) MCG/ACT inhaler 6.7 g 0  ?  Sig: Inhale 2 puffs into the lungs every 6 (six) hours as needed for wheezing or shortness of breath.  ?  ? Pulmonology:  Beta Agonists 2 Passed - 08/28/2021  1:25 PM  ?  ?  Passed - Last BP in normal range  ?  BP Readings from Last 1 Encounters:  ?08/14/21 116/64  ?   ?  ?  Passed - Last Heart Rate in normal range  ?  Pulse Readings from Last 1 Encounters:  ?08/14/21 77  ?   ?  ?  Passed - Valid encounter within last 12 months  ?  Recent Outpatient Visits   ?      ? 1 month ago Encounter for annual physical exam  ? [facility], Dionne Bucy, MD  ? 3 months ago Adhesive capsulitis of left shoulder  ? [facility] [place], Dionne Bucy, MD  ? 8 months ago Essential hypertension  ? [facility] Bacigalupo, Dionne Bucy, MD  ? 1 year ago Encounter for annual physical exam  ? [facility], Dionne Bucy, MD  ? 1 year ago Essential hypertension  ? [facility] Bacigalupo, Dionne Bucy, MD  ?  ?  ?Future Appointments   ?        ? In 4 months Bacigalupo, Dionne Bucy, MD [facility]  ?  ? ?  ?  ?  ? ?

## 2021-10-07 ENCOUNTER — Other Ambulatory Visit: Payer: Self-pay | Admitting: Family Medicine

## 2021-10-07 DIAGNOSIS — I10 Essential (primary) hypertension: Secondary | ICD-10-CM

## 2021-11-05 ENCOUNTER — Encounter: Payer: Self-pay | Admitting: Family Medicine

## 2021-11-05 ENCOUNTER — Ambulatory Visit: Payer: No Typology Code available for payment source | Admitting: Family Medicine

## 2021-11-05 DIAGNOSIS — B370 Candidal stomatitis: Secondary | ICD-10-CM | POA: Diagnosis not present

## 2021-11-05 DIAGNOSIS — I10 Essential (primary) hypertension: Secondary | ICD-10-CM | POA: Diagnosis not present

## 2021-11-05 DIAGNOSIS — B3731 Acute candidiasis of vulva and vagina: Secondary | ICD-10-CM | POA: Diagnosis not present

## 2021-11-05 MED ORDER — NYSTATIN 100000 UNIT/ML MT SUSP: 5.0000 mL | Freq: Four times a day (QID) | OROMUCOSAL | 0 refills | Status: DC

## 2021-11-05 MED ORDER — FLUCONAZOLE 150 MG PO TABS: 150.0000 mg | ORAL_TABLET | Freq: Once | ORAL | 0 refills | Status: AC

## 2021-11-05 NOTE — Assessment & Plan Note (Signed)
Initially elevated, but well controlled on manual recheck Continue current medications Advised low-sodium diet

## 2021-11-05 NOTE — Assessment & Plan Note (Signed)
Discussed importance of healthy weight management Discussed diet and exercise Encourage patient to continue with her lifestyle changes that she has made We did discuss the pros and cons of weight loss medications She will continue with intermittent fasting, low-carb Continue exercise Encouraged her to consider whether she is eating too few calories per day and may need more protein in her diet

## 2021-11-05 NOTE — Progress Notes (Signed)
I,Sulibeya S Dimas,acting as a Education administrator for Lavon Paganini, MD.,have documented all relevant documentation on the behalf of Lavon Paganini, MD,as directed by  Lavon Paganini, MD while in the presence of Lavon Paganini, MD.   Established patient visit   Patient: Hannah Roberson   DOB: 1971-03-28   51 y.o. Female  MRN: [id number] Visit Date: 11/05/2021  Today's healthcare provider: Lavon Paganini, MD   Chief Complaint  Patient presents with   Thrush   Obesity   Subjective    HPI  Patient C/O possible thrush in her mouth x 1 week. She reports skin peeling in her mouth.  No recent antibiotics. No pain with swallowing. Uncomfortable in her mouth.  Patient C/O vaginal discharge x two days. She denies any painful urination or burning.  White clumpy discharge. Seems like a yeast infection.  Frustrated by weight loss  Medications: Outpatient Medications Prior to Visit  Medication Sig   albuterol (PROAIR HFA) 108 (90 Base) MCG/ACT inhaler Inhale 2 puffs into the lungs every 6 (six) hours as needed for wheezing or shortness of breath.   esomeprazole (NEXIUM) 40 MG capsule Take 40 mg by mouth as needed.   losartan-hydrochlorothiazide (HYZAAR) 50-12.5 MG tablet TAKE 1 TABLET BY MOUTH EVERY DAY   No facility-administered medications prior to visit.    Review of Systems  HENT:  Negative for congestion and sore throat.   Respiratory:  Negative for cough and shortness of breath.   Genitourinary:  Positive for vaginal discharge. Negative for dysuria and hematuria.       Objective    BP 116/86 (BP Location: Left Arm, Cuff Size: Large)   Pulse 82   Temp 97.9 F (36.6 C) (Oral)   Resp 16   Ht '5\' 2"'$  (1.575 m)   Wt 231 lb 14.4 oz (105.2 kg)   LMP  (Approximate)   BMI 42.42 kg/m  BP Readings from Last 3 Encounters:  11/05/21 116/86  08/14/21 116/64  07/20/21 114/80   Wt Readings from Last 3 Encounters:  11/05/21 231 lb 14.4 oz (105.2 kg)  08/14/21 233 lb  (105.7 kg)  07/20/21 243 lb (110.2 kg)      Physical Exam Vitals reviewed.  Constitutional:      General: She is not in acute distress.    Appearance: She is well-developed.  HENT:     Head: Normocephalic and atraumatic.     Mouth/Throat:     Mouth: Mucous membranes are moist.     Comments: +white patches on palate, tongue, buccal mucosa Eyes:     General: No scleral icterus.    Conjunctiva/sclera: Conjunctivae normal.  Cardiovascular:     Rate and Rhythm: Normal rate and regular rhythm.  Pulmonary:     Effort: Pulmonary effort is normal. No respiratory distress.  Skin:    General: Skin is warm and dry.     Findings: No rash.  Neurological:     Mental Status: She is alert and oriented to person, place, and time.  Psychiatric:        Behavior: Behavior normal.       No results found for any visits on 11/05/21.  Assessment & Plan     Problem List Items Addressed This Visit       Cardiovascular and Mediastinum   Essential hypertension    Initially elevated, but well controlled on manual recheck Continue current medications Advised low-sodium diet        Other   Morbid obesity ([place]) - Primary  Discussed importance of healthy weight management Discussed diet and exercise Encourage patient to continue with her lifestyle changes that she has made We did discuss the pros and cons of weight loss medications She will continue with intermittent fasting, low-carb Continue exercise Encouraged her to consider whether she is eating too few calories per day and may need more protein in her diet      Other Visit Diagnoses     Thrush       Relevant Medications   fluconazole (DIFLUCAN) 150 MG tablet   nystatin (MYCOSTATIN) 100000 UNIT/ML suspension   Yeast vaginitis       Relevant Medications   fluconazole (DIFLUCAN) 150 MG tablet   nystatin (MYCOSTATIN) 100000 UNIT/ML suspension     Yeast vaginitis is recurrent-will be treated with fluconazole Thrush is new-no  recent antibiotic-we will treat with nystatin swish x5 to 7 days  Meds ordered this encounter  Medications   fluconazole (DIFLUCAN) 150 MG tablet    Sig: Take 1 tablet (150 mg total) by mouth once for 1 dose.    Dispense:  2 tablet    Refill:  0   nystatin (MYCOSTATIN) 100000 UNIT/ML suspension    Sig: Take 5 mLs (500,000 Units total) by mouth 4 (four) times daily.    Dispense:  60 mL    Refill:  0     No follow-ups on file.      I, Lavon Paganini, MD, have reviewed all documentation for this visit. The documentation on 11/05/21 for the exam, diagnosis, procedures, and orders are all accurate and complete.   Darnelle Derrick, Dionne Bucy, MD, MPH [facility]

## 2022-01-06 ENCOUNTER — Other Ambulatory Visit: Payer: Self-pay | Admitting: Family Medicine

## 2022-01-06 DIAGNOSIS — I10 Essential (primary) hypertension: Secondary | ICD-10-CM

## 2022-01-18 ENCOUNTER — Ambulatory Visit: Payer: No Typology Code available for payment source | Admitting: Family Medicine

## 2022-01-18 ENCOUNTER — Encounter: Payer: Self-pay | Admitting: Family Medicine

## 2022-01-18 VITALS — BP 129/85 | HR 69 | Temp 98.1°F | Resp 16 | Ht 62.0 in | Wt 233.1 lb

## 2022-01-18 DIAGNOSIS — R739 Hyperglycemia, unspecified: Secondary | ICD-10-CM | POA: Diagnosis not present

## 2022-01-18 DIAGNOSIS — J4520 Mild intermittent asthma, uncomplicated: Secondary | ICD-10-CM

## 2022-01-18 DIAGNOSIS — R7989 Other specified abnormal findings of blood chemistry: Secondary | ICD-10-CM | POA: Diagnosis not present

## 2022-01-18 DIAGNOSIS — K2100 Gastro-esophageal reflux disease with esophagitis, without bleeding: Secondary | ICD-10-CM

## 2022-01-18 DIAGNOSIS — I10 Essential (primary) hypertension: Secondary | ICD-10-CM

## 2022-01-18 NOTE — Assessment & Plan Note (Signed)
Controllable with water. Continue PPI prn, discussed potential harmful side effects when taken long term.

## 2022-01-18 NOTE — Assessment & Plan Note (Signed)
No current symptoms or recent exacerbations. Improved since stopped smoking. Continue prn albuterol.

## 2022-01-18 NOTE — Progress Notes (Signed)
   SUBJECTIVE:   CHIEF COMPLAINT / HPI:   Hypertension: - Medications: losartan-HCTZ - Compliance: good - Checking BP at home: no - Denies any SOB, CP, vision changes, LE edema, medication SEs, or symptoms of hypotension - Diet: has cut back on red meat.  - Exercise: treadmill 1.5 hours, 10000 steps. Swimming.   Asthma - Medications: albuterol PRN - Taking: not often - Common triggers: smoking, mold - ED visits/hospitalization in the last 6 months: no - Current symptoms: none  GERD - Meds: nexium prn - drinks large glass of water which helps symptoms.  - Symptoms:  none currently. Typically heartburn.  - Previous treatment: antacids and proton pump inhibitors.  Brother, sister, mother with DM.   OBJECTIVE:   BP 129/85 (BP Location: Left Arm, Patient Position: Sitting, Cuff Size: Large)   Pulse 69   Temp 98.1 F (36.7 C) (Oral)   Resp 16   Ht '5\' 2"'$  (1.575 m)   Wt 233 lb 1.6 oz (105.7 kg)   BMI 42.63 kg/m   Gen: well appearing, in NAD Card: RRR Lungs: CTAB Ext: WWP, no edema   ASSESSMENT/PLAN:   Essential hypertension Doing well on current regimen, no changes made today. Obtaining labs.   Asthma, exogenous No current symptoms or recent exacerbations. Improved since stopped smoking. Continue prn albuterol.   Esophagitis, reflux Controllable with water. Continue PPI prn, discussed potential harmful side effects when taken long term.   Morbid obesity ([place]) Discussed healthy weight management including diet and exercise. Last A1c elevated and in diabetic range, recheck today. Also with FH of DM. Discussed medication management. Previously referred to weight management clinic, gave number to schedule.    F/u 6 months or 1 month if A1c elevated and starting medication.   Myles Gip, DO

## 2022-01-18 NOTE — Assessment & Plan Note (Addendum)
Discussed healthy weight management including diet and exercise. Last A1c elevated and in diabetic range, recheck today. Also with FH of DM. Discussed medication management. Previously referred to weight management clinic, gave number to schedule.

## 2022-01-18 NOTE — Patient Instructions (Signed)
It was great to see you!  Our plans for today:  - Call [facility] - [phone]. - Keep up the great work with diet and exercise and trying to losing weight! We are checking some labs today, we will release these results to your MyChart.  Take care and seek immediate care sooner if you develop any concerns.   Dr. Ky Barban

## 2022-01-18 NOTE — Assessment & Plan Note (Signed)
Doing well on current regimen, no changes made today. Obtaining labs.

## 2022-01-19 LAB — COMPREHENSIVE METABOLIC PANEL
ALT: 43 IU/L — ABNORMAL HIGH (ref 0–32)
AST: 26 IU/L (ref 0–40)
Albumin/Globulin Ratio: 1.4 (ref 1.2–2.2)
Albumin: 3.9 g/dL (ref 3.9–4.9)
Alkaline Phosphatase: 162 IU/L — ABNORMAL HIGH (ref 44–121)
BUN/Creatinine Ratio: 16 (ref 9–23)
BUN: 11 mg/dL (ref 6–24)
Bilirubin Total: 0.3 mg/dL (ref 0.0–1.2)
CO2: 21 mmol/L (ref 20–29)
Calcium: 9.8 mg/dL (ref 8.7–10.2)
Chloride: 103 mmol/L (ref 96–106)
Creatinine, Ser: 0.68 mg/dL (ref 0.57–1.00)
Globulin, Total: 2.7 g/dL (ref 1.5–4.5)
Glucose: 127 mg/dL — ABNORMAL HIGH (ref 70–99)
Potassium: 4.3 mmol/L (ref 3.5–5.2)
Sodium: 139 mmol/L (ref 134–144)
Total Protein: 6.6 g/dL (ref 6.0–8.5)
eGFR: 106 mL/min/{1.73_m2} (ref >59)

## 2022-01-19 LAB — HEMOGLOBIN A1C
Est. average glucose Bld gHb Est-mCnc: 134 mg/dL
Hgb A1c MFr Bld: 6.3 % — ABNORMAL HIGH (ref 4.8–5.6)

## 2022-01-29 ENCOUNTER — Ambulatory Visit (INDEPENDENT_AMBULATORY_CARE_PROVIDER_SITE_OTHER): Payer: No Typology Code available for payment source | Admitting: Family Medicine

## 2022-01-29 ENCOUNTER — Encounter (INDEPENDENT_AMBULATORY_CARE_PROVIDER_SITE_OTHER): Payer: Self-pay | Admitting: Family Medicine

## 2022-01-29 VITALS — BP 137/79 | HR 67 | Temp 97.9°F | Ht 62.0 in | Wt 226.0 lb

## 2022-01-29 DIAGNOSIS — Z0289 Encounter for other administrative examinations: Secondary | ICD-10-CM

## 2022-01-29 DIAGNOSIS — Z6841 Body Mass Index (BMI) 40.0 and over, adult: Secondary | ICD-10-CM | POA: Insufficient documentation

## 2022-01-29 DIAGNOSIS — E119 Type 2 diabetes mellitus without complications: Secondary | ICD-10-CM | POA: Insufficient documentation

## 2022-01-29 DIAGNOSIS — I10 Essential (primary) hypertension: Secondary | ICD-10-CM

## 2022-01-29 DIAGNOSIS — E782 Mixed hyperlipidemia: Secondary | ICD-10-CM | POA: Insufficient documentation

## 2022-01-29 NOTE — Progress Notes (Unsigned)
  Office: [phone]  /  Fax: [phone]  Program Consultation  [name] was seen in clinic today to discuss her diagnosis of obesity. We did a program consultation to discuss her options.  We discussed obesity as a disease and the importance of a proper evaluation of all the factors contributing to the disease.  We discussed the importance of long term lifestyle changes which include nutrition, exercise and behavioral modifications as well as the importance of customizing this to her specific health and social needs.  Current health conditions we discussed and we expect to improve include diabetes, hypertension and hyperlipidemia.  We discussed the goals of this program is to improve her overall health and not simply achieve a specific BMI.  Frequent visit are very important to patient success. I plan to see her every 2 weeks for the first 3 months and then evaluate the visit frequency after that time. I explained obesity is a life-long chronic disease and long term treatments would be required. Medications to help her follow his eating plan may be offered as appropriate but are not required. All medication decisions will be made together after benefits and side effects are discussed in depth.  The clinic rules were reviewed including the late policy, cancellation policy and the no show fees. The $99 program fee was discussed as well.  [name] appears to be in the action stage of change and  is approved to start the program   51 minutes was spent today on this visit including the above counseling, pre-visit chart review, and post-visit documentation.

## 2022-02-10 ENCOUNTER — Encounter (INDEPENDENT_AMBULATORY_CARE_PROVIDER_SITE_OTHER): Payer: Self-pay | Admitting: Family Medicine

## 2022-02-10 ENCOUNTER — Ambulatory Visit (INDEPENDENT_AMBULATORY_CARE_PROVIDER_SITE_OTHER): Payer: No Typology Code available for payment source | Admitting: Family Medicine

## 2022-02-10 VITALS — BP 126/76 | HR 89 | Temp 98.1°F | Ht 62.0 in | Wt 229.0 lb

## 2022-02-10 DIAGNOSIS — Z1331 Encounter for screening for depression: Secondary | ICD-10-CM

## 2022-02-10 DIAGNOSIS — E1169 Type 2 diabetes mellitus with other specified complication: Secondary | ICD-10-CM

## 2022-02-10 DIAGNOSIS — E782 Mixed hyperlipidemia: Secondary | ICD-10-CM

## 2022-02-10 DIAGNOSIS — R0602 Shortness of breath: Secondary | ICD-10-CM | POA: Diagnosis not present

## 2022-02-10 DIAGNOSIS — E669 Obesity, unspecified: Secondary | ICD-10-CM

## 2022-02-10 DIAGNOSIS — Z6841 Body Mass Index (BMI) 40.0 and over, adult: Secondary | ICD-10-CM | POA: Diagnosis not present

## 2022-02-10 DIAGNOSIS — E119 Type 2 diabetes mellitus without complications: Secondary | ICD-10-CM | POA: Insufficient documentation

## 2022-02-10 DIAGNOSIS — R5383 Other fatigue: Secondary | ICD-10-CM | POA: Diagnosis not present

## 2022-02-11 LAB — CMP14+EGFR
ALT: 45 IU/L — ABNORMAL HIGH (ref 0–32)
AST: 42 IU/L — ABNORMAL HIGH (ref 0–40)
Albumin/Globulin Ratio: 1.7 (ref 1.2–2.2)
Albumin: 4.5 g/dL (ref 3.9–4.9)
Alkaline Phosphatase: 136 IU/L — ABNORMAL HIGH (ref 44–121)
BUN/Creatinine Ratio: 18 (ref 9–23)
BUN: 11 mg/dL (ref 6–24)
Bilirubin Total: 0.4 mg/dL (ref 0.0–1.2)
CO2: 21 mmol/L (ref 20–29)
Calcium: 9.6 mg/dL (ref 8.7–10.2)
Chloride: 99 mmol/L (ref 96–106)
Creatinine, Ser: 0.62 mg/dL (ref 0.57–1.00)
Globulin, Total: 2.6 g/dL (ref 1.5–4.5)
Glucose: 104 mg/dL — ABNORMAL HIGH (ref 70–99)
Potassium: 4.3 mmol/L (ref 3.5–5.2)
Sodium: 137 mmol/L (ref 134–144)
Total Protein: 7.1 g/dL (ref 6.0–8.5)
eGFR: 108 mL/min/{1.73_m2} (ref >59)

## 2022-02-11 LAB — LIPID PANEL WITH LDL/HDL RATIO
Cholesterol, Total: 211 mg/dL — ABNORMAL HIGH (ref 100–199)
HDL: 55 mg/dL (ref >39)
LDL Chol Calc (NIH): 132 mg/dL — ABNORMAL HIGH (ref 0–99)
LDL/HDL Ratio: 2.4 ratio (ref 0.0–3.2)
Triglycerides: 137 mg/dL (ref 0–149)
VLDL Cholesterol Cal: 24 mg/dL (ref 5–40)

## 2022-02-11 LAB — T4, FREE: Free T4: 1.06 ng/dL (ref 0.82–1.77)

## 2022-02-11 LAB — VITAMIN D 25 HYDROXY (VIT D DEFICIENCY, FRACTURES): Vit D, 25-Hydroxy: 28 ng/mL — ABNORMAL LOW (ref 30.0–100.0)

## 2022-02-11 LAB — T3: T3, Total: 150 ng/dL (ref 71–180)

## 2022-02-11 LAB — TSH: TSH: 1.43 u[IU]/mL (ref 0.450–4.500)

## 2022-02-11 LAB — INSULIN, RANDOM: INSULIN: 22.8 u[IU]/mL (ref 2.6–24.9)

## 2022-02-14 NOTE — Progress Notes (Unsigned)
Chief Complaint:   OBESITY Hannah Roberson (MR# [id number]) is a 51 y.o. female who presents for evaluation and treatment of obesity and related comorbidities. Current BMI is Body mass index is 41.88 kg/m. Hannah Roberson has been struggling with her weight for many years and has been unsuccessful in either losing weight, maintaining weight loss, or reaching her healthy weight goal.  Hannah Roberson is currently in the action stage of change and ready to dedicate time achieving and maintaining a healthier weight. Hannah Roberson is interested in becoming our patient and working on intensive lifestyle modifications including (but not limited to) diet and exercise for weight loss.  Hannah Roberson's habits were reviewed today and are as follows: Her family eats meals together, she thinks her family will eat healthier with her, her desired weight loss is 99 lbs, she started gaining weight at age 84, her heaviest weight ever was 235 pounds, she has significant food cravings issues, she snacks frequently in the evenings, she skips meals frequently, and she frequently eats larger portions than normal.  Depression Screen Hannah Roberson's Food and Mood (modified PHQ-9) score was 7.     02/10/2022    8:30 AM  Depression screen PHQ 2/9  Decreased Interest 3  Down, Depressed, Hopeless 0  PHQ - 2 Score 3  Altered sleeping 0  Tired, decreased energy 1  Change in appetite 0  Feeling bad or failure about yourself  3  Trouble concentrating 0  Moving slowly or fidgety/restless 0  Suicidal thoughts 0  PHQ-9 Score 7  Difficult doing work/chores Not difficult at all   Subjective:   1. Other fatigue Hannah Roberson admits to daytime somnolence and admits to waking up still tired. Patient has a history of symptoms of daytime fatigue and morning fatigue. Hannah Roberson generally gets 7 hours of sleep per night, and states that she has generally restful sleep. Snoring is present. Apneic episodes are not present. Epworth Sleepiness Score is 1.   2.  SOBOE (shortness of breath on exertion) Hannah Roberson notes increasing shortness of breath with exercising and seems to be worsening over time with weight gain. She notes getting out of breath sooner with activity than she used to. This has not gotten worse recently. Hannah Roberson denies shortness of breath at rest or orthopnea.  3. Type 2 diabetes mellitus with other specified complication, without long-term current use of insulin (HCC) Hannah Roberson has type 2 diabetes mellitus with recent A1c of 6.3, controlled with diet.  4. Mixed hyperlipidemia Hannah Roberson is not on statin, and she has a history of hyperlipidemia.  She is ready to work on her diet and exercise to improve.  Assessment/Plan:   1. Other fatigue Hannah Roberson does feel that her weight is causing her energy to be lower than it should be. Fatigue may be related to obesity, depression or many other causes. Labs will be ordered, and in the meanwhile, Hannah Roberson will focus on self care including making healthy food choices, increasing physical activity and focusing on stress reduction.  - EKG 12-Lead - CMP14+EGFR - CBC with Differential/Platelet - VITAMIN D 25 Hydroxy (Vit-D Deficiency, Fractures) - TSH - T4, free - T3  2. SOBOE (shortness of breath on exertion) Hannah Roberson does feel that she gets out of breath more easily that she used to when she exercises. Hannah Roberson's shortness of breath appears to be obesity related and exercise induced. Hannah Roberson's VO2 is at expected level, and she will work on her diet and exercise.  Will continue to monitor closely.  - EKG 12-Lead  3.  Type 2 diabetes mellitus with other specified complication, without long-term current use of insulin ([place]) We will check labs today, and Hannah Roberson will begin her category 2 plan.  We will follow-up at her next visit.  - Insulin, random  4. Mixed hyperlipidemia We will check labs today. Hannah Roberson will her category 2 plan, exercise, and weight loss efforts. Orders and follow up as documented in  patient record.   - Lipid Panel With LDL/HDL Ratio  5. Depression screening Hannah Roberson had a positive depression screening. Depression is commonly associated with obesity and often results in emotional eating behaviors. We will monitor this closely and work on CBT to help improve the non-hunger eating patterns. Referral to Psychology may be required if no improvement is seen as she continues in our clinic.  6. Obesity with current BMI of 41.9 Hannah Roberson is currently in the action stage of change and her goal is to continue with weight loss efforts. I recommend Hannah Roberson begin the structured treatment plan as follows:  She has agreed to the Category 2 Plan.  Exercise goals: No exercise has been prescribed now, while we concentrate on nutritional changes.  Behavioral modification strategies: increasing lean protein intake.  She was informed of the importance of frequent follow-up visits to maximize her success with intensive lifestyle modifications for her multiple health conditions. She was informed we would discuss her lab results at her next visit unless there is a critical issue that needs to be addressed sooner. Hannah Roberson agreed to keep her next visit at the agreed upon time to discuss these results.  Objective:   Blood pressure 126/76, pulse 89, temperature 98.1 F (36.7 C), height _0  (1.575 m), weight 229 lb (103.9 kg), SpO2 96 %. Body mass index is 41.88 kg/m.  EKG: Normal sinus rhythm, rate 71 BPM.  Indirect Calorimeter completed today shows a VO2 of 242 and a REE of 1670.  Her calculated basal metabolic rate is 8032 thus her basal metabolic rate is better than expected.  General: Cooperative, alert, well developed, in no acute distress. HEENT: Conjunctivae and lids unremarkable. Cardiovascular: Regular rhythm.  Lungs: Normal work of breathing. Neurologic: No focal deficits.   Lab Results  Component Value Date   CREATININE 0.62 02/10/2022   BUN 11 02/10/2022   NA 137 02/10/2022    K 4.3 02/10/2022   CL 99 02/10/2022   CO2 21 02/10/2022   Lab Results  Component Value Date   ALT 45 (H) 02/10/2022   AST 42 (H) 02/10/2022   ALKPHOS 136 (H) 02/10/2022   BILITOT 0.4 02/10/2022   Lab Results  Component Value Date   HGBA1C 6.3 (H) 01/18/2022   HGBA1C 7.2 (H) 07/20/2021   HGBA1C CANCELED 02/25/2020   Lab Results  Component Value Date   INSULIN 22.8 02/10/2022   Lab Results  Component Value Date   TSH 1.430 02/10/2022   Lab Results  Component Value Date   CHOL 211 (H) 02/10/2022   HDL 55 02/10/2022   LDLCALC 132 (H) 02/10/2022   TRIG 137 02/10/2022   CHOLHDL 4.3 12/30/2020   Lab Results  Component Value Date   WBC 9.7 07/01/2020   HGB 12.2 07/01/2020   HCT 38.3 07/01/2020   MCV 77 (L) 07/01/2020   PLT 385 07/01/2020   No results found for: "IRON", "TIBC", "FERRITIN"  Attestation Statements:   Reviewed by clinician on day of visit: allergies, medications, problem list, medical history, surgical history, family history, social history, and previous encounter notes.  Time spent on  visit including pre-visit chart review and post-visit charting and care was 45 minutes.   I, Trixie Dredge, am acting as transcriptionist for Dennard Nip, MD.  I have reviewed the above documentation for accuracy and completeness, and I agree with the above. - ***

## 2022-02-24 ENCOUNTER — Encounter (INDEPENDENT_AMBULATORY_CARE_PROVIDER_SITE_OTHER): Payer: Self-pay | Admitting: Family Medicine

## 2022-02-24 ENCOUNTER — Ambulatory Visit (INDEPENDENT_AMBULATORY_CARE_PROVIDER_SITE_OTHER): Payer: No Typology Code available for payment source | Admitting: Family Medicine

## 2022-02-24 VITALS — BP 115/74 | HR 74 | Temp 98.1°F | Ht 62.0 in | Wt 224.0 lb

## 2022-02-24 DIAGNOSIS — E669 Obesity, unspecified: Secondary | ICD-10-CM

## 2022-02-24 DIAGNOSIS — E1169 Type 2 diabetes mellitus with other specified complication: Secondary | ICD-10-CM

## 2022-02-24 DIAGNOSIS — I10 Essential (primary) hypertension: Secondary | ICD-10-CM | POA: Diagnosis not present

## 2022-02-24 DIAGNOSIS — E7800 Pure hypercholesterolemia, unspecified: Secondary | ICD-10-CM

## 2022-02-24 DIAGNOSIS — Z6841 Body Mass Index (BMI) 40.0 and over, adult: Secondary | ICD-10-CM

## 2022-02-24 DIAGNOSIS — R7989 Other specified abnormal findings of blood chemistry: Secondary | ICD-10-CM

## 2022-02-24 DIAGNOSIS — E559 Vitamin D deficiency, unspecified: Secondary | ICD-10-CM

## 2022-02-24 MED ORDER — VITAMIN D (ERGOCALCIFEROL) 1.25 MG (50000 UNIT) PO CAPS: 50000.0000 [IU] | ORAL_CAPSULE | ORAL | 0 refills | Status: DC

## 2022-03-01 NOTE — Progress Notes (Signed)
Chief Complaint:   OBESITY Hannah Roberson is here to discuss her progress with her obesity treatment plan along with follow-up of her obesity related diagnoses. Hannah Roberson is on the Category 2 Plan and states she is following her eating plan approximately 100% of the time. Hannah Roberson states she is doing 0 minutes 0 times per week.  Today's visit was #: 2 Starting weight: 229 lbs Starting date: 02/10/2022 Today's weight: 224 lbs Today's date: 02/24/2022 Total lbs lost to date: 5 Total lbs lost since last in-office visit: 5  Interim History: Hannah Roberson has done well with weight loss. She had some difficulty with eating breakfast. She felt full most of the time, but nothing decreased the thoughts of food.   Subjective:   1. Type 2 diabetes mellitus with other specified complication, without long-term current use of insulin (HCC) Hannah Roberson's last A1c was 6.3 (down from 7.2), improving; and insulin level at 22.8. I discussed labs with the patient today.   2. Elevated LFTs Hannah Roberson's ALT and AST are slightly elevated, but better than previously (CT abdominal from 2017 showed fatty liver infiltration). I discussed labs with the patient today.   3. Pure hypercholesterolemia Hannah Roberson's LDL is elevated. She is not on statin and she declines. I discussed labs with the patient today.   4. Essential hypertension Hannah Roberson's blood pressure is well controlled on losartan-hydrochlorothiazide. She denies side effects.   5. Vitamin D deficiency Hannah Roberson has a new diagnosis. Her Vitamin D level is 28.0 and she notes fatigue. I discussed labs with the patient today.   Assessment/Plan:   1. Type 2 diabetes mellitus with other specified complication, without long-term current use of insulin ([place]) Hannah Roberson expressed concerns about starting any medications at this time. She will continue her diet and we will follow-up on her labs in 3 months.   2. Elevated LFTs Hannah Roberson's diagnosis is likely non-alcoholic fatty liver  disease. We sill follow and recheck labs in 3 months. She will continue with her diet and exercise.   3. Pure hypercholesterolemia Hannah Roberson will continue with her diet and we will recheck labs in 3 months.  4. Essential hypertension Hannah Roberson will continue her medications and diet, and we will continue to follow.  5. Vitamin D deficiency Hannah Roberson agreed to start prescription Vitamin D 50,000 IU every week with no refills. We will recheck labs in 3 months. She will follow-up for routine testing of Vitamin D, at least 2-3 times per year to avoid over-replacement.  - Vitamin D, Ergocalciferol, (DRISDOL) 1.25 MG (50000 UNIT) CAPS capsule; Take 1 capsule (50,000 Units total) by mouth every 7 (seven) days.  Dispense: 4 capsule; Refill: 0  6. Obesity, Current BMI 41.0 Hannah Roberson is currently in the action stage of change. As such, her goal is to continue with weight loss efforts. She has agreed to the Category 2 Plan and keeping a food journal and adhering to recommended goals of 500 calories and 35 grams of protein at supper daily.   She will continue Category 2 with options for lunch and for eating out strategies.   Exercise goals: As is.   Behavioral modification strategies: increasing lean protein intake, decreasing simple carbohydrates, meal planning and cooking strategies, and travel eating strategies.  Hannah Roberson has agreed to follow-up with our clinic in 2 weeks. She was informed of the importance of frequent follow-up visits to maximize her success with intensive lifestyle modifications for her multiple health conditions.   Objective:   Blood pressure 115/74, pulse 74, temperature 98.1 F (36.7  C), height '5\' 2"'$  (1.575 m), weight 224 lb (101.6 kg), SpO2 99 %. Body mass index is 40.97 kg/m.  General: Cooperative, alert, well developed, in no acute distress. HEENT: Conjunctivae and lids unremarkable. Cardiovascular: Regular rhythm.  Lungs: Normal work of breathing. Neurologic: No focal  deficits.   Lab Results  Component Value Date   CREATININE 0.62 02/10/2022   BUN 11 02/10/2022   NA 137 02/10/2022   K 4.3 02/10/2022   CL 99 02/10/2022   CO2 21 02/10/2022   Lab Results  Component Value Date   ALT 45 (H) 02/10/2022   AST 42 (H) 02/10/2022   ALKPHOS 136 (H) 02/10/2022   BILITOT 0.4 02/10/2022   Lab Results  Component Value Date   HGBA1C 6.3 (H) 01/18/2022   HGBA1C 7.2 (H) 07/20/2021   HGBA1C CANCELED 02/25/2020   Lab Results  Component Value Date   INSULIN 22.8 02/10/2022   Lab Results  Component Value Date   TSH 1.430 02/10/2022   Lab Results  Component Value Date   CHOL 211 (H) 02/10/2022   HDL 55 02/10/2022   LDLCALC 132 (H) 02/10/2022   TRIG 137 02/10/2022   CHOLHDL 4.3 12/30/2020   Lab Results  Component Value Date   VD25OH 28.0 (L) 02/10/2022   Lab Results  Component Value Date   WBC 9.7 07/01/2020   HGB 12.2 07/01/2020   HCT 38.3 07/01/2020   MCV 77 (L) 07/01/2020   PLT 385 07/01/2020   No results found for: "IRON", "TIBC", "FERRITIN"  Attestation Statements:   Reviewed by clinician on day of visit: allergies, medications, problem list, medical history, surgical history, family history, social history, and previous encounter notes.  Time spent on visit including pre-visit chart review and post-visit care and charting was 43 minutes.   I, Trixie Dredge, am acting as transcriptionist for Dennard Nip, MD.  I have reviewed the above documentation for accuracy and completeness, and I agree with the above. -  Dennard Nip, MD

## 2022-03-15 ENCOUNTER — Encounter (INDEPENDENT_AMBULATORY_CARE_PROVIDER_SITE_OTHER): Payer: Self-pay | Admitting: Family Medicine

## 2022-03-15 ENCOUNTER — Ambulatory Visit (INDEPENDENT_AMBULATORY_CARE_PROVIDER_SITE_OTHER): Payer: No Typology Code available for payment source | Admitting: Family Medicine

## 2022-03-15 VITALS — BP 128/78 | HR 73 | Temp 98.1°F | Ht 62.0 in | Wt 222.0 lb

## 2022-03-15 DIAGNOSIS — E559 Vitamin D deficiency, unspecified: Secondary | ICD-10-CM

## 2022-03-15 DIAGNOSIS — Z6841 Body Mass Index (BMI) 40.0 and over, adult: Secondary | ICD-10-CM

## 2022-03-15 DIAGNOSIS — E669 Obesity, unspecified: Secondary | ICD-10-CM | POA: Diagnosis not present

## 2022-03-15 DIAGNOSIS — I10 Essential (primary) hypertension: Secondary | ICD-10-CM | POA: Diagnosis not present

## 2022-03-15 MED ORDER — VITAMIN D (ERGOCALCIFEROL) 1.25 MG (50000 UNIT) PO CAPS: 50000.0000 [IU] | ORAL_CAPSULE | ORAL | 0 refills | Status: DC

## 2022-03-22 NOTE — Progress Notes (Signed)
Chief Complaint:   OBESITY Hannah Roberson is here to discuss her progress with her obesity treatment plan along with follow-up of her obesity related diagnoses. Hannah Roberson is on the Category 2 Plan and keeping a food journal and adhering to recommended goals of 500 calories and 35 grams of protein at supper daily and states she is following her eating plan approximately 100% of the time. Hannah Roberson states she is doing 0 minutes 0 times per week.  Today's visit was #: 3 Starting weight: 229 lbs Starting date: 9/13/20223 Today's weight: 222 lbs Today's date: 03/15/2022 Total lbs lost to date: 7 Total lbs lost since last in-office visit: 2  Interim History: Hannah Roberson continues to do well with weight loss. She would like more options for dinner and she would like to know how to figure the nutrition of recipes.   Subjective:   1. Vitamin D deficiency Hannah Roberson was started on Vitamin D, and she denies nausea, vomiting, or muscle weakness.   2. Essential hypertension Hannah Roberson is stable on her medications. She is working on her diet and weight loss.   Assessment/Plan:   1. Vitamin D deficiency We will refill prescription Vitamin D for 1 month. Shannyn will follow-up for routine testing of Vitamin D, at least 2-3 times per year to avoid over-replacement.  - Vitamin D, Ergocalciferol, (DRISDOL) 1.25 MG (50000 UNIT) CAPS capsule; Take 1 capsule (50,000 Units total) by mouth every 7 (seven) days.  Dispense: 4 capsule; Refill: 0  2. Essential hypertension Hannah Roberson will continue with her diet, exercise, and medications and watch for signs of hypotension.  3. Obesity, Current BMI 40.6 Hannah Roberson is currently in the action stage of change. As such, her goal is to continue with weight loss efforts. She has agreed to the Category 2 Plan and keeping a food journal and adhering to recommended goals of 400-600 calories and 40+ grams of protein at supper daily.   Hannah Roberson was showed how to journal and how to enter  her recipes into the journaling app.   Behavioral modification strategies: increasing lean protein intake and dealing with family or coworker sabotage.  Hannah Roberson has agreed to follow-up with our clinic in 2 weeks. She was informed of the importance of frequent follow-up visits to maximize her success with intensive lifestyle modifications for her multiple health conditions.   Objective:   Blood pressure 128/78, pulse 73, temperature 98.1 F (36.7 C), height '5\' 2"'$  (1.575 m), weight 222 lb (100.7 kg), SpO2 96 %. Body mass index is 40.6 kg/m.  General: Cooperative, alert, well developed, in no acute distress. HEENT: Conjunctivae and lids unremarkable. Cardiovascular: Regular rhythm.  Lungs: Normal work of breathing. Neurologic: No focal deficits.   Lab Results  Component Value Date   CREATININE 0.62 02/10/2022   BUN 11 02/10/2022   NA 137 02/10/2022   K 4.3 02/10/2022   CL 99 02/10/2022   CO2 21 02/10/2022   Lab Results  Component Value Date   ALT 45 (H) 02/10/2022   AST 42 (H) 02/10/2022   ALKPHOS 136 (H) 02/10/2022   BILITOT 0.4 02/10/2022   Lab Results  Component Value Date   HGBA1C 6.3 (H) 01/18/2022   HGBA1C 7.2 (H) 07/20/2021   HGBA1C CANCELED 02/25/2020   Lab Results  Component Value Date   INSULIN 22.8 02/10/2022   Lab Results  Component Value Date   TSH 1.430 02/10/2022   Lab Results  Component Value Date   CHOL 211 (H) 02/10/2022   HDL 55 02/10/2022  LDLCALC 132 (H) 02/10/2022   TRIG 137 02/10/2022   CHOLHDL 4.3 12/30/2020   Lab Results  Component Value Date   VD25OH 28.0 (L) 02/10/2022   Lab Results  Component Value Date   WBC 9.7 07/01/2020   HGB 12.2 07/01/2020   HCT 38.3 07/01/2020   MCV 77 (L) 07/01/2020   PLT 385 07/01/2020   No results found for: "IRON", "TIBC", "FERRITIN"  Attestation Statements:   Reviewed by clinician on day of visit: allergies, medications, problem list, medical history, surgical history, family history,  social history, and previous encounter notes.   I, Trixie Dredge, am acting as transcriptionist for Dennard Nip, MD.  I have reviewed the above documentation for accuracy and completeness, and I agree with the above. -  Dennard Nip, MD

## 2022-03-29 ENCOUNTER — Ambulatory Visit (INDEPENDENT_AMBULATORY_CARE_PROVIDER_SITE_OTHER): Payer: No Typology Code available for payment source | Admitting: Family Medicine

## 2022-03-29 ENCOUNTER — Encounter (INDEPENDENT_AMBULATORY_CARE_PROVIDER_SITE_OTHER): Payer: Self-pay | Admitting: Family Medicine

## 2022-03-29 VITALS — BP 121/75 | HR 69 | Temp 98.1°F | Ht 62.0 in | Wt 220.0 lb

## 2022-03-29 DIAGNOSIS — F32A Depression, unspecified: Secondary | ICD-10-CM | POA: Insufficient documentation

## 2022-03-29 DIAGNOSIS — E669 Obesity, unspecified: Secondary | ICD-10-CM

## 2022-03-29 DIAGNOSIS — Z6841 Body Mass Index (BMI) 40.0 and over, adult: Secondary | ICD-10-CM

## 2022-03-29 DIAGNOSIS — F3289 Other specified depressive episodes: Secondary | ICD-10-CM | POA: Diagnosis not present

## 2022-03-29 DIAGNOSIS — E559 Vitamin D deficiency, unspecified: Secondary | ICD-10-CM

## 2022-03-29 DIAGNOSIS — E1169 Type 2 diabetes mellitus with other specified complication: Secondary | ICD-10-CM

## 2022-03-29 MED ORDER — VITAMIN D (ERGOCALCIFEROL) 1.25 MG (50000 UNIT) PO CAPS: 50000.0000 [IU] | ORAL_CAPSULE | ORAL | 0 refills | Status: DC

## 2022-04-06 NOTE — Progress Notes (Unsigned)
Chief Complaint:   OBESITY Wonder is here to discuss her progress with her obesity treatment plan along with follow-up of her obesity related diagnoses. Vashon is on {MWMwtlossportion/plan2:23431} and states she is following her eating plan approximately ***% of the time. Sharicka states she is *** *** minutes *** times per week.  Today's visit was #: *** Starting weight: *** Starting date: *** Today's weight: *** Today's date: 03/29/2022 Total lbs lost to date: *** Total lbs lost since last in-office visit: ***  Interim History: ***  Subjective:   1. Type 2 diabetes mellitus with other specified complication, without long-term current use of insulin (HCC) ***  2. Vitamin D deficiency ***  3. Other depression, emotional eating behavior ***  Assessment/Plan:   1. Type 2 diabetes mellitus with other specified complication, without long-term current use of insulin (HCC) ***  2. Vitamin D deficiency *** - Vitamin D, Ergocalciferol, (DRISDOL) 1.25 MG (50000 UNIT) CAPS capsule; Take 1 capsule (50,000 Units total) by mouth every 7 (seven) days.  Dispense: 4 capsule; Refill: 0  3. Other depression, emotional eating behavior ***  4. Obesity, Current BMI 40.3 Caylah is currently in the action stage of change. As such, her goal is to continue with weight loss efforts. She has agreed to the Category 2 Plan.   Recipes were given and options for breakfast was provided.   Exercise goals: As is.   Behavioral modification strategies: increasing lean protein intake, decreasing simple carbohydrates, meal planning and cooking strategies, and holiday eating strategies .  Ivon has agreed to follow-up with our clinic in 2 weeks. She was informed of the importance of frequent follow-up visits to maximize her success with intensive lifestyle modifications for her multiple health conditions.   Objective:   Blood pressure 121/75, pulse 69, temperature 98.1 F (36.7 C), height 5'  2" (1.575 m), weight 220 lb (99.8 kg), SpO2 96 %. Body mass index is 40.24 kg/m.  General: Cooperative, alert, well developed, in no acute distress. HEENT: Conjunctivae and lids unremarkable. Cardiovascular: Regular rhythm.  Lungs: Normal work of breathing. Neurologic: No focal deficits.   Lab Results  Component Value Date   CREATININE 0.62 02/10/2022   BUN 11 02/10/2022   NA 137 02/10/2022   K 4.3 02/10/2022   CL 99 02/10/2022   CO2 21 02/10/2022   Lab Results  Component Value Date   ALT 45 (H) 02/10/2022   AST 42 (H) 02/10/2022   ALKPHOS 136 (H) 02/10/2022   BILITOT 0.4 02/10/2022   Lab Results  Component Value Date   HGBA1C 6.3 (H) 01/18/2022   HGBA1C 7.2 (H) 07/20/2021   HGBA1C CANCELED 02/25/2020   Lab Results  Component Value Date   INSULIN 22.8 02/10/2022   Lab Results  Component Value Date   TSH 1.430 02/10/2022   Lab Results  Component Value Date   CHOL 211 (H) 02/10/2022   HDL 55 02/10/2022   LDLCALC 132 (H) 02/10/2022   TRIG 137 02/10/2022   CHOLHDL 4.3 12/30/2020   Lab Results  Component Value Date   VD25OH 28.0 (L) 02/10/2022   Lab Results  Component Value Date   WBC 9.7 07/01/2020   HGB 12.2 07/01/2020   HCT 38.3 07/01/2020   MCV 77 (L) 07/01/2020   PLT 385 07/01/2020   No results found for: "IRON", "TIBC", "FERRITIN"  Attestation Statements:   Reviewed by clinician on day of visit: allergies, medications, problem list, medical history, surgical history, family history, social history, and previous  encounter notes.   I, Trixie Dredge, am acting as transcriptionist for Dennard Nip, MD.  I have reviewed the above documentation for accuracy and completeness, and I agree with the above. -  ***

## 2022-04-12 ENCOUNTER — Ambulatory Visit (INDEPENDENT_AMBULATORY_CARE_PROVIDER_SITE_OTHER): Payer: No Typology Code available for payment source | Admitting: Family Medicine

## 2022-04-12 ENCOUNTER — Encounter (INDEPENDENT_AMBULATORY_CARE_PROVIDER_SITE_OTHER): Payer: Self-pay | Admitting: Family Medicine

## 2022-04-12 VITALS — BP 146/84 | HR 71 | Temp 97.9°F | Ht 62.0 in | Wt 221.0 lb

## 2022-04-12 DIAGNOSIS — E1169 Type 2 diabetes mellitus with other specified complication: Secondary | ICD-10-CM

## 2022-04-12 DIAGNOSIS — Z6841 Body Mass Index (BMI) 40.0 and over, adult: Secondary | ICD-10-CM | POA: Diagnosis not present

## 2022-04-12 DIAGNOSIS — E669 Obesity, unspecified: Secondary | ICD-10-CM | POA: Diagnosis not present

## 2022-04-12 DIAGNOSIS — E559 Vitamin D deficiency, unspecified: Secondary | ICD-10-CM | POA: Diagnosis not present

## 2022-04-12 MED ORDER — VITAMIN D (ERGOCALCIFEROL) 1.25 MG (50000 UNIT) PO CAPS: 50000.0000 [IU] | ORAL_CAPSULE | ORAL | 0 refills | Status: DC

## 2022-04-19 ENCOUNTER — Other Ambulatory Visit: Payer: Self-pay | Admitting: Family Medicine

## 2022-04-19 DIAGNOSIS — I10 Essential (primary) hypertension: Secondary | ICD-10-CM

## 2022-04-19 NOTE — Telephone Encounter (Signed)
Requested Prescriptions  Pending Prescriptions Disp Refills   losartan-hydrochlorothiazide (HYZAAR) 50-12.5 MG tablet [Pharmacy Med Name: LOSARTAN-HCTZ 50-12.5 MG TAB] 90 tablet 0    Sig: TAKE 1 TABLET BY MOUTH EVERY DAY     Cardiovascular: ARB + Diuretic Combos Failed - 04/19/2022  2:28 AM      Failed - Last BP in normal range    BP Readings from Last 1 Encounters:  04/12/22 (!) 146/84         Passed - K in normal range and within 180 days    Potassium  Date Value Ref Range Status  02/10/2022 4.3 3.5 - 5.2 mmol/L Final  11/17/2011 4.2 3.5 - 5.1 mmol/L Final         Passed - Na in normal range and within 180 days    Sodium  Date Value Ref Range Status  02/10/2022 137 134 - 144 mmol/L Final  11/17/2011 140 136 - 145 mmol/L Final         Passed - Cr in normal range and within 180 days    Creatinine  Date Value Ref Range Status  11/17/2011 0.68 0.60 - 1.30 mg/dL Final   Creatinine, Ser  Date Value Ref Range Status  02/10/2022 0.62 0.57 - 1.00 mg/dL Final         Passed - eGFR is 10 or above and within 180 days    EGFR (African American)  Date Value Ref Range Status  11/17/2011 >60  Final   GFR calc Af Amer  Date Value Ref Range Status  07/01/2020 125 >59 mL/min/1.73 Final    Comment:    **In accordance with recommendations from the NKF-ASN Task force,**   [facility] is in the process of updating its eGFR calculation to the   2021 CKD-EPI creatinine equation that estimates kidney function   without a race variable.    EGFR (Non-African Amer.)  Date Value Ref Range Status  11/17/2011 >60  Final    Comment:    eGFR values <33m/min/1.73 m2 may be an indication of chronic kidney disease (CKD). Calculated eGFR is useful in patients with stable renal function. The eGFR calculation will not be reliable in acutely ill patients when serum creatinine is changing rapidly. It is not useful in  patients on dialysis. The eGFR calculation may not be applicable to patients at  the low and high extremes of body sizes, pregnant women, and vegetarians.    GFR calc non Af Amer  Date Value Ref Range Status  07/01/2020 109 >59 mL/min/1.73 Final   eGFR  Date Value Ref Range Status  02/10/2022 108 >59 mL/min/1.73 Final         Passed - Patient is not pregnant      Passed - Valid encounter within last 6 months    Recent Outpatient Visits           3 months ago [facility] L[facility]   5 months [facility]   9 months [facility]   11 months ago [facility]hesive capsulitis of left shoulder   [facility]   1 year ago Essential hypertension   [facility]

## 2022-04-26 NOTE — Progress Notes (Signed)
Chief Complaint:   OBESITY Hannah Roberson is here to discuss her progress with her obesity treatment plan along with follow-up of her obesity related diagnoses. Hannah Roberson is on the Category 2 Plan and states she is following her eating plan approximately 90% of the time. Hannah Roberson states she is doing cardio and weights for 60 minutes 7 times per week.  Today's visit was #: 5 Starting weight: 229 lbs Starting date: 02/10/2022 Today's weight: 221 lbs Today's date: 04/12/2022 Total lbs lost to date: 8 Total lbs lost since last in-office visit: 0  Interim History: Hannah Roberson has done well overall with weight loss. She is frustrated with slow progress. She has been increasing her walking at least 10,000 steps daily for 7 days. We discussed focusing on getting in protein.   Subjective:   1. Type 2 diabetes mellitus with other specified complication, without long-term current use of insulin (HCC) Hannah Roberson's last A1c was 6.3 and insulin 22.8. She is not on medications. We discussed the role of insulin resistance in metabolism. She is working on [employer] and increasing her exercise.   2. Vitamin D deficiency Hannah Roberson is taking Vitamin D prescription with no side effects noted.  Assessment/Plan:   1. Type 2 diabetes mellitus with other specified complication, without long-term current use of insulin (HCC) Hannah Roberson will continue with her healthy eating plan, and will continue to work on increasing her exercise.   2. Vitamin D deficiency Hannah Roberson will continue prescription Vitamin D 50,000 IU every week, and we will refill for 1 month.   - Vitamin D, Ergocalciferol, (DRISDOL) 1.25 MG (50000 UNIT) CAPS capsule; Take 1 capsule (50,000 Units total) by mouth every 7 (seven) days.  Dispense: 4 capsule; Refill: 0  3. Obesity, Current BMI 40.4 Hannah Roberson is currently in the action stage of change. As such, her goal is to continue with weight loss efforts. She has agreed to the Category 2 Plan.   Exercise  goals: As is.   Behavioral modification strategies: increasing lean protein intake, decreasing simple carbohydrates, and holiday eating strategies .  Hannah Roberson has agreed to follow-up with our clinic in 2 to 3 weeks. She was informed of the importance of frequent follow-up visits to maximize her success with intensive lifestyle modifications for her multiple health conditions.   Objective:   Blood pressure (!) 146/84, pulse 71, temperature 97.9 F (36.6 C), height '5\' 2"'$  (1.575 m), weight 221 lb (100.2 kg), SpO2 94 %. Body mass index is 40.42 kg/m.  General: Cooperative, alert, well developed, in no acute distress. HEENT: Conjunctivae and lids unremarkable. Cardiovascular: Regular rhythm.  Lungs: Normal work of breathing. Neurologic: No focal deficits.   Lab Results  Component Value Date   CREATININE 0.62 02/10/2022   BUN 11 02/10/2022   NA 137 02/10/2022   K 4.3 02/10/2022   CL 99 02/10/2022   CO2 21 02/10/2022   Lab Results  Component Value Date   ALT 45 (H) 02/10/2022   AST 42 (H) 02/10/2022   ALKPHOS 136 (H) 02/10/2022   BILITOT 0.4 02/10/2022   Lab Results  Component Value Date   HGBA1C 6.3 (H) 01/18/2022   HGBA1C 7.2 (H) 07/20/2021   HGBA1C CANCELED 02/25/2020   Lab Results  Component Value Date   INSULIN 22.8 02/10/2022   Lab Results  Component Value Date   TSH 1.430 02/10/2022   Lab Results  Component Value Date   CHOL 211 (H) 02/10/2022   HDL 55 02/10/2022   LDLCALC 132 (H) 02/10/2022  TRIG 137 02/10/2022   CHOLHDL 4.3 12/30/2020   Lab Results  Component Value Date   VD25OH 28.0 (L) 02/10/2022   Lab Results  Component Value Date   WBC 9.7 07/01/2020   HGB 12.2 07/01/2020   HCT 38.3 07/01/2020   MCV 77 (L) 07/01/2020   PLT 385 07/01/2020   No results found for: "IRON", "TIBC", "FERRITIN"  Attestation Statements:   Reviewed by clinician on day of visit: allergies, medications, problem list, medical history, surgical history, family  history, social history, and previous encounter notes.  I have personally spent 42 minutes total time today in preparation, patient care, and documentation for this visit, including the following: review of clinical lab tests; review of medical tests/procedures/services.   I, Trixie Dredge, am acting as transcriptionist for Dennard Nip, MD.  I have reviewed the above documentation for accuracy and completeness, and I agree with the above. -  Dennard Nip, MD

## 2022-05-03 ENCOUNTER — Ambulatory Visit (INDEPENDENT_AMBULATORY_CARE_PROVIDER_SITE_OTHER): Payer: No Typology Code available for payment source | Admitting: Family Medicine

## 2022-06-02 ENCOUNTER — Ambulatory Visit (INDEPENDENT_AMBULATORY_CARE_PROVIDER_SITE_OTHER): Payer: No Typology Code available for payment source | Admitting: Family Medicine

## 2022-06-25 NOTE — Progress Notes (Unsigned)
Established patient visit   Patient: Hannah Roberson   DOB: [birth year]   52 y.o. Female  MRN: [id number] Visit Date: 06/28/2022  Today's healthcare provider: Lavon Paganini, MD   No chief complaint on file.  Subjective    HPI  Patient is in due to believing she has thrush due to really bad cotton mouth and feels like skin is peeling inside of her mouth. Reports her grandson had it and she was not aware. Reports symptoms for 3-4 days. Reports she is starting to have issues everywhere so it may be overall. Reports eyes are itchy lately as well with a film over them.   -Flu vaccine: Declined -Eye Exam: Need to update -Shingles vaccine:  Medications: Outpatient Medications Prior to Visit  Medication Sig   losartan-hydrochlorothiazide (HYZAAR) 50-12.5 MG tablet TAKE 1 TABLET BY MOUTH EVERY DAY   valACYclovir (VALTREX) 1000 MG tablet TAKE TWO TABLETS EVERY 12 HOURS FOR ONE DAY AS ONSET OF COLD SORE SYMPTOMS.   Vitamin D, Ergocalciferol, (DRISDOL) 1.25 MG (50000 UNIT) CAPS capsule Take 1 capsule (50,000 Units total) by mouth every 7 (seven) days.   albuterol (PROAIR HFA) 108 (90 Base) MCG/ACT inhaler Inhale 2 puffs into the lungs every 6 (six) hours as needed for wheezing or shortness of breath. (Patient not taking: Reported on 06/28/2022)   No facility-administered medications prior to visit.    Review of Systems per HPI     Objective    BP 120/84 (BP Location: Left Wrist, Patient Position: Sitting, Cuff Size: Normal)   Pulse 88    Physical Exam Vitals reviewed.  Constitutional:      General: She is not in acute distress.    Appearance: She is well-developed.  HENT:     Head: Normocephalic and atraumatic.     Right Ear: Tympanic membrane, ear canal and external ear normal.     Left Ear: Tympanic membrane, ear canal and external ear normal.     Nose: Nose normal.     Mouth/Throat:     Mouth: Mucous membranes are moist.     Pharynx: Oropharyngeal exudate (white)  present.  Eyes:     General: No scleral icterus.    Conjunctiva/sclera: Conjunctivae normal.  Cardiovascular:     Rate and Rhythm: Normal rate and regular rhythm.  Pulmonary:     Effort: Pulmonary effort is normal. No respiratory distress.  Skin:    General: Skin is warm and dry.     Findings: No rash.  Neurological:     Mental Status: She is alert and oriented to person, place, and time.  Psychiatric:        Behavior: Behavior normal.       No results found for any visits on 06/28/22.  Assessment & Plan     1. Thrush - recurrent issue - failed topical treatment (compliance may have been a factor) - will treat with fluconazole x7d - patient is diabetic  2. Yeast vaginitis - treat with fluconazole   3. Eustachian tube disorder, right - new problem - normal exam - symptoms are c/w eustachian tube dysfucntion - will treat with flonase  4. Seasonal allergic rhinitis due to pollen - encourage daily antihistamine - flonase as above  5. Decreased hearing of right ear - refer to ENT for further evaluation - may be related to ETD as above, but patient reports it came before that - Ambulatory referral to ENT  Meds ordered this encounter  Medications   DISCONTD: fluconazole (  DIFLUCAN) 100 MG tablet    Sig: Take 2 tablets (200 mg total) by mouth daily for 1 day, THEN 1 tablet (100 mg total) daily for 6 days.    Dispense:  8 tablet    Refill:  0   DISCONTD: fluconazole (DIFLUCAN) 100 MG tablet    Sig: Take 2 tablets (200 mg total) by mouth daily for 1 day, THEN 1 tablet (100 mg total) daily for 6 days.    Dispense:  8 tablet    Refill:  0   fluticasone (FLONASE) 50 MCG/ACT nasal spray    Sig: Place 2 sprays into both nostrils daily.    Dispense:  16 g    Refill:  6   fluconazole (DIFLUCAN) 100 MG tablet    Sig: Take 2 tablets (200 mg total) by mouth daily for 1 day, THEN 1 tablet (100 mg total) daily for 6 days.    Dispense:  8 tablet    Refill:  0      Return  in about 4 weeks (around 07/26/2022) for chronic disease f/u.      I, Lavon Paganini, MD, have reviewed all documentation for this visit. The documentation on 06/28/22 for the exam, diagnosis, procedures, and orders are all accurate and complete.   Bacigalupo, Dionne Bucy, MD, MPH [facility]

## 2022-06-28 ENCOUNTER — Ambulatory Visit: Payer: No Typology Code available for payment source | Admitting: Family Medicine

## 2022-06-28 ENCOUNTER — Encounter: Payer: Self-pay | Admitting: Family Medicine

## 2022-06-28 ENCOUNTER — Other Ambulatory Visit: Payer: Self-pay | Admitting: Family Medicine

## 2022-06-28 VITALS — BP 120/84 | HR 88

## 2022-06-28 DIAGNOSIS — H9191 Unspecified hearing loss, right ear: Secondary | ICD-10-CM

## 2022-06-28 DIAGNOSIS — B3731 Acute candidiasis of vulva and vagina: Secondary | ICD-10-CM | POA: Diagnosis not present

## 2022-06-28 DIAGNOSIS — J301 Allergic rhinitis due to pollen: Secondary | ICD-10-CM

## 2022-06-28 DIAGNOSIS — B370 Candidal stomatitis: Secondary | ICD-10-CM | POA: Diagnosis not present

## 2022-06-28 DIAGNOSIS — H6991 Unspecified Eustachian tube disorder, right ear: Secondary | ICD-10-CM

## 2022-06-28 MED ORDER — FLUCONAZOLE 100 MG PO TABS: ORAL_TABLET | ORAL | 0 refills | Status: DC

## 2022-06-28 MED ORDER — FLUTICASONE PROPIONATE 50 MCG/ACT NA SUSP: 2.0000 | Freq: Every day | NASAL | 6 refills | Status: DC

## 2022-06-28 MED ORDER — FLUCONAZOLE 100 MG PO TABS: ORAL_TABLET | ORAL | 0 refills | Status: AC

## 2022-07-24 ENCOUNTER — Other Ambulatory Visit: Payer: Self-pay | Admitting: Family Medicine

## 2022-07-25 ENCOUNTER — Other Ambulatory Visit: Payer: Self-pay | Admitting: Family Medicine

## 2022-07-25 DIAGNOSIS — I10 Essential (primary) hypertension: Secondary | ICD-10-CM

## 2022-07-29 ENCOUNTER — Ambulatory Visit: Payer: No Typology Code available for payment source | Admitting: Family Medicine

## 2022-07-29 ENCOUNTER — Encounter: Payer: Self-pay | Admitting: Family Medicine

## 2022-07-29 VITALS — BP 133/84 | HR 80 | Temp 97.9°F | Resp 20

## 2022-07-29 DIAGNOSIS — Z1231 Encounter for screening mammogram for malignant neoplasm of breast: Secondary | ICD-10-CM

## 2022-07-29 DIAGNOSIS — I152 Hypertension secondary to endocrine disorders: Secondary | ICD-10-CM

## 2022-07-29 DIAGNOSIS — M542 Cervicalgia: Secondary | ICD-10-CM

## 2022-07-29 DIAGNOSIS — R682 Dry mouth, unspecified: Secondary | ICD-10-CM | POA: Diagnosis not present

## 2022-07-29 DIAGNOSIS — E1159 Type 2 diabetes mellitus with other circulatory complications: Secondary | ICD-10-CM | POA: Diagnosis not present

## 2022-07-29 DIAGNOSIS — E1169 Type 2 diabetes mellitus with other specified complication: Secondary | ICD-10-CM

## 2022-07-29 DIAGNOSIS — E785 Hyperlipidemia, unspecified: Secondary | ICD-10-CM

## 2022-07-29 LAB — POCT GLYCOSYLATED HEMOGLOBIN (HGB A1C)
Est. average glucose Bld gHb Est-mCnc: 126
Hemoglobin A1C: 6 % — AB (ref 4.0–5.6)

## 2022-07-29 MED ORDER — CYCLOBENZAPRINE HCL 5 MG PO TABS: 5.0000 mg | ORAL_TABLET | Freq: Three times a day (TID) | ORAL | 1 refills | Status: DC | PRN

## 2022-07-29 MED ORDER — MELOXICAM 15 MG PO TABS: 15.0000 mg | ORAL_TABLET | Freq: Every day | ORAL | 0 refills | Status: DC

## 2022-07-29 NOTE — Assessment & Plan Note (Signed)
Discussed importance of healthy weight management Discussed diet and exercise  

## 2022-07-29 NOTE — Assessment & Plan Note (Signed)
Well controlled Continue current medications Recheck metabolic panel F/u in 6 months  

## 2022-07-29 NOTE — Progress Notes (Signed)
I,Sulibeya S Dimas,acting as a scribe for Lavon Paganini, MD.,have documented all relevant documentation on the behalf of Lavon Paganini, MD,as directed by  Lavon Paganini, MD while in the presence of Lavon Paganini, MD.   Established patient visit   Patient: Hannah Roberson   DOB: 22-Oct-1970   52 y.o. Female  MRN: [id number] Visit Date: 07/29/2022  Today's healthcare provider: Lavon Paganini, MD   Chief Complaint  Patient presents with   Diabetes   Hypertension   Hyperlipidemia   Neck Pain    She reports bad sleeping position while staying with her mother at the hospital.   Subjective    HPI HPI     Neck Pain   This is a new problem.  Recent episode started 1 to 4 weeks ago.  The problem has been gradually worsening since onset.   Pain is present in the  right posterior neck.  Severity of the pain is moderate.  Pain occurs constantly.  The symptoms are aggravated by flexion , extension, turning right and turning left.  Treatments: none. Additional comments: She reports bad sleeping position while staying with her mother at the hospital.      Last edited by Dorian Pod, [place] on 07/29/2022  9:29 AM.     "Crick" x2 weeks Tried bengay topical and biofreeze patches Affecting sleep   Diabetes Mellitus Type II, follow-up  Lab Results  Component Value Date   HGBA1C 6.0 (A) 07/29/2022   HGBA1C 6.3 (H) 01/18/2022   HGBA1C 7.2 (H) 07/20/2021   Last seen for diabetes 6 months ago.  Management since then includes continuing the same treatment. She reports  no oral treatment  compliance with treatment.  Home blood sugar records:  not being checked  Episodes of hypoglycemia? No    Current insulin regiment: none Most Recent Eye Exam:   --------------------------------------------------------------------------------------------------- Hypertension, follow-up  BP Readings from Last 3 Encounters:  07/29/22 133/84  06/28/22 120/84  04/12/22  (!) 146/84   Wt Readings from Last 3 Encounters:  04/12/22 221 lb (100.2 kg)  03/29/22 220 lb (99.8 kg)  03/15/22 222 lb (100.7 kg)     She was last seen for hypertension 6 months ago.  BP at that visit was 129/85. Management since that visit includes no changes. She reports excellent compliance with treatment. She is not having side effects.    Outside blood pressures are not being checked.  --------------------------------------------------------------------------------------------------- Lipid/Cholesterol, follow-up  Last Lipid Panel: Lab Results  Component Value Date   CHOL 211 (H) 02/10/2022   LDLCALC 132 (H) 02/10/2022   HDL 55 02/10/2022   TRIG 137 02/10/2022    She was last seen for this 6 months ago.  Management since that visit includes no changes.  She reports excellent compliance with treatment. She is not having side effects.   Last metabolic panel Lab Results  Component Value Date   GLUCOSE 104 (H) 02/10/2022   NA 137 02/10/2022   K 4.3 02/10/2022   BUN 11 02/10/2022   CREATININE 0.62 02/10/2022   EGFR 108 02/10/2022   GFRNONAA 109 07/01/2020   CALCIUM 9.6 02/10/2022   AST 42 (H) 02/10/2022   ALT 45 (H) 02/10/2022   The 10-year ASCVD risk score (Arnett DK, et al., 2019) is: 4.1%  ---------------------------------------------------------------------------------------------------   Medications: Outpatient Medications Prior to Visit  Medication Sig   albuterol (PROAIR HFA) 108 (90 Base) MCG/ACT inhaler Inhale 2 puffs into the lungs every 6 (six) hours as needed for wheezing  or shortness of breath.   flunisolide (NASALIDE) 25 MCG/ACT (0.025%) SOLN PLACE 2 SPRAYS INTO THE NOSE 2 (TWO) TIMES DAILY.   losartan-hydrochlorothiazide (HYZAAR) 50-12.5 MG tablet TAKE 1 TABLET BY MOUTH EVERY DAY   valACYclovir (VALTREX) 1000 MG tablet TAKE TWO TABLETS EVERY 12 HOURS FOR ONE DAY AS ONSET OF COLD SORE SYMPTOMS.   Vitamin D, Ergocalciferol, (DRISDOL) 1.25 MG  (50000 UNIT) CAPS capsule Take 1 capsule (50,000 Units total) by mouth every 7 (seven) days.   No facility-administered medications prior to visit.    Review of Systems per HPI     Objective    BP 133/84 (BP Location: Left Arm, Patient Position: Sitting, Cuff Size: Normal)   Pulse 80   Temp 97.9 F (36.6 C) (Temporal)   Resp 20    Physical Exam Vitals reviewed.  Constitutional:      General: She is not in acute distress.    Appearance: Normal appearance. She is well-developed. She is not diaphoretic.  HENT:     Head: Normocephalic and atraumatic.  Eyes:     General: No scleral icterus.    Conjunctiva/sclera: Conjunctivae normal.  Neck:     Thyroid: No thyromegaly.  Cardiovascular:     Rate and Rhythm: Normal rate and regular rhythm.     Heart sounds: Normal heart sounds. No murmur heard. Pulmonary:     Effort: Pulmonary effort is normal. No respiratory distress.     Breath sounds: Normal breath sounds. No wheezing, rhonchi or rales.  Musculoskeletal:     Cervical back: Neck supple.     Right lower leg: No edema.     Left lower leg: No edema.  Lymphadenopathy:     Cervical: No cervical adenopathy.  Skin:    General: Skin is warm and dry.     Findings: No rash.  Neurological:     Mental Status: She is alert and oriented to person, place, and time. Mental status is at baseline.  Psychiatric:        Mood and Affect: Mood normal.        Behavior: Behavior normal.       Results for orders placed or performed in visit on 07/29/22  POCT glycosylated hemoglobin (Hb A1C)  Result Value Ref Range   Hemoglobin A1C 6.0 (A) 4.0 - 5.6 %   Est. average glucose Bld gHb Est-mCnc 126     Assessment & Plan     Problem List Items Addressed This Visit       Cardiovascular and Mediastinum   Hypertension associated with diabetes ([place])    Well controlled Continue current medications Recheck metabolic panel F/u in 6 months       Relevant Orders   Comprehensive metabolic  panel     Endocrine   Diabetes mellitus ([place]) - Primary    Well controlled with diet control On no medications UTD on vaccines, advised on need for eye exam, foot exam completed today UACR today On ACEi/ARB Discussed diet and exercise F/u in 6 months       Relevant Orders   POCT glycosylated hemoglobin (Hb A1C) (Completed)   Urine Microalbumin w/creat. ratio   Hyperlipidemia associated with type 2 diabetes mellitus ([place])    Reviewed last lipid panel Not currently on a statin Recheck FLP and CMP Discussed diet and exercise       Relevant Orders   Comprehensive metabolic panel   Lipid panel     Other   Morbid obesity ([place])    Discussed importance  of healthy weight management Discussed diet and exercise       Other Visit Diagnoses     Dry mouth       Neck pain       Breast cancer screening by mammogram       Relevant Orders   MM 3D SCREEN BREAST BILATERAL       Neck pain - likely spasm - use NSAID and flexeril prn - gentle stretching - ice or heat ok  For dry mouth - will discuss with ENT when she sees them - trial of sialogogues  Return in about 6 months (around 01/27/2023) for CPE.      I, Lavon Paganini, MD, have reviewed all documentation for this visit. The documentation on 07/29/22 for the exam, diagnosis, procedures, and orders are all accurate and complete.   Tattiana Fakhouri, Dionne Bucy, MD, MPH [facility]

## 2022-07-29 NOTE — Assessment & Plan Note (Signed)
Reviewed last lipid panel Not currently on a statin Recheck FLP and CMP Discussed diet and exercise  

## 2022-07-29 NOTE — Assessment & Plan Note (Signed)
Well controlled with diet control On no medications UTD on vaccines, advised on need for eye exam, foot exam completed today UACR today On ACEi/ARB Discussed diet and exercise F/u in 6 months

## 2022-07-30 ENCOUNTER — Telehealth: Payer: Self-pay

## 2022-07-30 MED ORDER — ROSUVASTATIN CALCIUM 5 MG PO TABS: 5.0000 mg | ORAL_TABLET | Freq: Every day | ORAL | 1 refills | Status: DC

## 2022-07-30 NOTE — Telephone Encounter (Signed)
-----   Message from Virginia Crews, MD sent at 07/30/2022  8:36 AM EST ----- Normal/stable labs, except increase in liver function tests.  Old CT showed fatty liver, which is likely cause. Also ok to refer to GI if she wants further workup, because I don't see that this has happened before.  Cholesterol still above goal also - consider statin to lower risk (Crestor '5mg'$  daily)

## 2022-07-30 NOTE — Telephone Encounter (Signed)
Patient advised. She agreed to starting Crestor 5 mg daily. She will hold off on referral to GI. Will discuss more at 6 mth fu.

## 2022-07-31 LAB — LIPID PANEL
Chol/HDL Ratio: 4.6 ratio — ABNORMAL HIGH (ref 0.0–4.4)
Cholesterol, Total: 232 mg/dL — ABNORMAL HIGH (ref 100–199)
HDL: 50 mg/dL (ref >39)
LDL Chol Calc (NIH): 149 mg/dL — ABNORMAL HIGH (ref 0–99)
Triglycerides: 180 mg/dL — ABNORMAL HIGH (ref 0–149)
VLDL Cholesterol Cal: 33 mg/dL (ref 5–40)

## 2022-07-31 LAB — MICROALBUMIN / CREATININE URINE RATIO
Creatinine, Urine: 44.2 mg/dL
Microalb/Creat Ratio: 8 mg/g creat (ref 0–29)
Microalbumin, Urine: 3.4 ug/mL

## 2022-07-31 LAB — COMPREHENSIVE METABOLIC PANEL
ALT: 63 IU/L — ABNORMAL HIGH (ref 0–32)
AST: 46 IU/L — ABNORMAL HIGH (ref 0–40)
Albumin/Globulin Ratio: 1.3 (ref 1.2–2.2)
Albumin: 4.3 g/dL (ref 3.8–4.9)
Alkaline Phosphatase: 173 IU/L — ABNORMAL HIGH (ref 44–121)
BUN/Creatinine Ratio: 27 — ABNORMAL HIGH (ref 9–23)
BUN: 17 mg/dL (ref 6–24)
Bilirubin Total: 0.3 mg/dL (ref 0.0–1.2)
CO2: 21 mmol/L (ref 20–29)
Calcium: 9.6 mg/dL (ref 8.7–10.2)
Chloride: 100 mmol/L (ref 96–106)
Creatinine, Ser: 0.63 mg/dL (ref 0.57–1.00)
Globulin, Total: 3.2 g/dL (ref 1.5–4.5)
Glucose: 126 mg/dL — ABNORMAL HIGH (ref 70–99)
Potassium: 5 mmol/L (ref 3.5–5.2)
Sodium: 140 mmol/L (ref 134–144)
Total Protein: 7.5 g/dL (ref 6.0–8.5)
eGFR: 107 mL/min/{1.73_m2} (ref >59)

## 2022-08-25 ENCOUNTER — Other Ambulatory Visit: Payer: Self-pay | Admitting: Family Medicine

## 2022-10-01 ENCOUNTER — Ambulatory Visit: Payer: No Typology Code available for payment source | Admitting: Physician Assistant

## 2022-10-01 ENCOUNTER — Other Ambulatory Visit (HOSPITAL_COMMUNITY)
Admission: RE | Admit: 2022-10-01 | Discharge: 2022-10-01 | Disposition: A | Discharge status: Home / Self Care | Payer: No Typology Code available for payment source | Source: Ambulatory Visit | Attending: Physician Assistant | Admitting: Physician Assistant

## 2022-10-01 VITALS — BP 126/81 | HR 75 | Temp 97.9°F | Wt 234.0 lb

## 2022-10-01 DIAGNOSIS — N760 Acute vaginitis: Secondary | ICD-10-CM | POA: Diagnosis not present

## 2022-10-01 DIAGNOSIS — M5489 Other dorsalgia: Secondary | ICD-10-CM

## 2022-10-01 LAB — POCT URINALYSIS DIPSTICK
Bilirubin, UA: NEGATIVE
Blood, UA: NEGATIVE
Glucose, UA: NEGATIVE
Ketones, UA: NEGATIVE
Leukocytes, UA: NEGATIVE
Nitrite, UA: NEGATIVE
Protein, UA: NEGATIVE
Spec Grav, UA: 1.02 (ref 1.010–1.025)
Urobilinogen, UA: 0.2 E.U./dL
pH, UA: 5 (ref 5.0–8.0)

## 2022-10-01 NOTE — Progress Notes (Unsigned)
    Vivien Rota DeSanto,acting as a Neurosurgeon for [employer], PA-C.,have documented all relevant documentation on the behalf of Debera Lat, PA-C,as directed by  [employer], PA-C while in the presence of [employer], PA-C.    Established patient visit   Patient: Hannah Roberson   DOB: [birth year]   52 y.o. Female  MRN: [id number] Visit Date: 10/01/2022  Today's healthcare provider: Debera Lat, PA-C   No chief complaint on file.  Subjective    HPI  Patient is a 52 year old female who presents for evaluation of vaginal burning and itching. Patient declines any STI testing In menopause, not issue dryness No vaginal discharge , odd smell Urine no blod  Back pain Twick the back  Low buildign  Medications: Outpatient Medications Prior to Visit  Medication Sig   albuterol (PROAIR HFA) 108 (90 Base) MCG/ACT inhaler Inhale 2 puffs into the lungs every 6 (six) hours as needed for wheezing or shortness of breath.   cyclobenzaprine (FLEXERIL) 5 MG tablet Take 1 tablet (5 mg total) by mouth 3 (three) times daily as needed for muscle spasms.   flunisolide (NASALIDE) 25 MCG/ACT (0.025%) SOLN PLACE 2 SPRAYS INTO THE NOSE 2 (TWO) TIMES DAILY.   losartan-hydrochlorothiazide (HYZAAR) 50-12.5 MG tablet TAKE 1 TABLET BY MOUTH EVERY DAY   meloxicam (MOBIC) 15 MG tablet TAKE 1 TABLET (15 MG TOTAL) BY MOUTH DAILY.   rosuvastatin (CRESTOR) 5 MG tablet Take 1 tablet (5 mg total) by mouth daily.   valACYclovir (VALTREX) 1000 MG tablet TAKE TWO TABLETS EVERY 12 HOURS FOR ONE DAY AS ONSET OF COLD SORE SYMPTOMS.   Vitamin D, Ergocalciferol, (DRISDOL) 1.25 MG (50000 UNIT) CAPS capsule Take 1 capsule (50,000 Units total) by mouth every 7 (seven) days.   No facility-administered medications prior to visit.    Review of Systems  Genitourinary:  Positive for menstrual problem (irregular since starting in menopause.  Last one 2 months ago.). Negative for difficulty urinating, dyspareunia, dysuria,  enuresis, frequency, hematuria, pelvic pain, urgency, vaginal bleeding, vaginal discharge and vaginal pain.  Musculoskeletal:  Positive for back pain.    {Labs  Heme  Chem  Endocrine  Serology  Results Review (optional):23779}   Objective    BP 126/81 (BP Location: Right Arm, Patient Position: Sitting, Cuff Size: Large)   Pulse 75   Temp 97.9 F (36.6 C) (Oral)   Wt 234 lb (106.1 kg)   SpO2 97%   BMI 42.80 kg/m  {Show previous vital signs (optional):23777}  Physical Exam  ***  No results found for any visits on 10/01/22.  Assessment & Plan     ***  No follow-ups on file.      {provider attestation***:1}   Debera Lat, PA-C  [facility] [phone] (phone) [phone] (fax)  [facility]

## 2022-10-03 ENCOUNTER — Encounter: Payer: Self-pay | Admitting: Physician Assistant

## 2022-10-04 LAB — CERVICOVAGINAL ANCILLARY ONLY
Bacterial Vaginitis (gardnerella): NEGATIVE
Candida Glabrata: NEGATIVE
Candida Vaginitis: NEGATIVE
Comment: NEGATIVE
Comment: NEGATIVE
Comment: NEGATIVE

## 2022-10-05 NOTE — Progress Notes (Signed)
Please, let pt know that her labs were negative for BV and yeast infection. Continue treatment for back pain as discussed.

## 2022-10-07 ENCOUNTER — Telehealth: Payer: No Typology Code available for payment source | Admitting: Physician Assistant

## 2022-10-07 VITALS — Temp 99.0°F

## 2022-10-07 DIAGNOSIS — J029 Acute pharyngitis, unspecified: Secondary | ICD-10-CM | POA: Diagnosis not present

## 2022-10-07 LAB — POCT RAPID STREP A (OFFICE): Rapid Strep A Screen: NEGATIVE

## 2022-10-07 NOTE — Progress Notes (Unsigned)
MyChart Video Visit    Virtual Visit via Video Note   This format is felt to be most appropriate for this patient at this time. Physical exam was limited by quality of the video and audio technology used for the visit.   Patient location: home Provider location: office  I discussed the limitations of evaluation and management by telemedicine and the availability of in person appointments. The patient expressed understanding and agreed to proceed.  Patient: Hannah Roberson   DOB: 06/12/1970   52 y.o. Female  MRN: [id number] Visit Date: 10/07/2022  Today's healthcare provider: Debera Lat, PA-C   No chief complaint on file.  Subjective    HPI  Patient is a 52 year old female who presents via video visit for evaluation of sore throat and fever.  She states that she began having symptoms yesterday.  She also reports that 4 co-workers have tested positive for strep throat.   She woke during the night because the throat was hurting so bad.    Patient states that they told her on the phone she could do a video visit and then come by the office and we would go to the parking lot and do a strep test for her.    Medications: Outpatient Medications Prior to Visit  Medication Sig   albuterol (PROAIR HFA) 108 (90 Base) MCG/ACT inhaler Inhale 2 puffs into the lungs every 6 (six) hours as needed for wheezing or shortness of breath.   cyclobenzaprine (FLEXERIL) 5 MG tablet Take 1 tablet (5 mg total) by mouth 3 (three) times daily as needed for muscle spasms.   flunisolide (NASALIDE) 25 MCG/ACT (0.025%) SOLN PLACE 2 SPRAYS INTO THE NOSE 2 (TWO) TIMES DAILY.   losartan-hydrochlorothiazide (HYZAAR) 50-12.5 MG tablet TAKE 1 TABLET BY MOUTH EVERY DAY   meloxicam (MOBIC) 15 MG tablet TAKE 1 TABLET (15 MG TOTAL) BY MOUTH DAILY.   rosuvastatin (CRESTOR) 5 MG tablet Take 1 tablet (5 mg total) by mouth daily.   valACYclovir (VALTREX) 1000 MG tablet TAKE TWO TABLETS EVERY 12 HOURS FOR ONE DAY AS  ONSET OF COLD SORE SYMPTOMS.   Vitamin D, Ergocalciferol, (DRISDOL) 1.25 MG (50000 UNIT) CAPS capsule Take 1 capsule (50,000 Units total) by mouth every 7 (seven) days. (Patient not taking: Reported on 10/07/2022)   No facility-administered medications prior to visit.    Review of Systems  Constitutional:  Positive for fatigue and fever. Negative for chills and diaphoresis.  HENT:  Positive for congestion, ear pain (right stuffed up), postnasal drip, sore throat, trouble swallowing and voice change. Negative for ear discharge, hearing loss, rhinorrhea, sinus pressure, sinus pain, sneezing and tinnitus.   Eyes:  Negative for photophobia, pain, discharge, redness, itching and visual disturbance.  Respiratory:  Negative for cough, shortness of breath and wheezing.   Cardiovascular:  Negative for chest pain, palpitations and leg swelling.  Gastrointestinal:  Negative for abdominal pain.  Musculoskeletal:  Negative for arthralgias.  Neurological:  Negative for dizziness, light-headedness and headaches.       Objective    Temp 99 F (37.2 C)      Physical Exam Vitals reviewed.  Constitutional:      General: She is not in acute distress.    Appearance: She is well-developed.  HENT:     Head: Normocephalic and atraumatic.  Eyes:     General: No scleral icterus.    Conjunctiva/sclera: Conjunctivae normal.  Cardiovascular:     Rate and Rhythm: Normal rate and regular rhythm.  Pulmonary:     Effort: Pulmonary effort is normal. No respiratory distress.  Skin:    General: Skin is warm and dry.     Findings: No rash.  Neurological:     Mental Status: She is alert and oriented to person, place, and time.  Psychiatric:        Behavior: Behavior normal.        Assessment & Plan    1. Sore throat Acute problem Mild, x 1-2 days Endorses having low-grade fever +sick contacts for strep throat - POCT rapid strep A  Modified centor score was 1-2 depending on fever/low grade vs  high grade  Considering that she is diabetic, has a hx of smoking/10 pack-years, +sick contacts, rapid strep test was performed and was negative Advised symptomatic treatment with warm salt gargles, plenty of fluids, humidifier in bedroom. Honey and hot tea. Will call in an abx if her symptoms worsen  No follow-ups on file.     I discussed the assessment and treatment plan with the patient. The patient was provided an opportunity to ask questions and all were answered. The patient agreed with the plan and demonstrated an understanding of the instructions.   The patient was advised to call back or seek an in-person evaluation if the symptoms worsen or if the condition fails to improve as anticipated.  I provided 11 minutes of non-face-to-face time during this encounter.  The patient was advised to call back or seek an in-person evaluation if the symptoms worsen or if the condition fails to improve as anticipated.  I discussed the assessment and treatment plan with the patient. The patient was provided an opportunity to ask questions and all were answered. The patient agreed with the plan and demonstrated an understanding of the instructions.  I, Debera Lat, PA-C have reviewed all documentation for this visit. The documentation on  10/07/22 for the exam, diagnosis, procedures, and orders are all accurate and complete.  Debera Lat, [facility], M[facility] [phone] (phone) [phone] (fax)   [facility]

## 2022-10-09 ENCOUNTER — Encounter: Payer: Self-pay | Admitting: Physician Assistant

## 2022-10-13 ENCOUNTER — Other Ambulatory Visit: Payer: Self-pay | Admitting: Family Medicine

## 2022-10-13 ENCOUNTER — Telehealth: Payer: Self-pay | Admitting: Family Medicine

## 2022-10-13 DIAGNOSIS — E559 Vitamin D deficiency, unspecified: Secondary | ICD-10-CM

## 2022-10-13 MED ORDER — VALACYCLOVIR HCL 1 G PO TABS: ORAL_TABLET | ORAL | 1 refills | Status: DC

## 2022-10-13 MED ORDER — VITAMIN D (ERGOCALCIFEROL) 1.25 MG (50000 UNIT) PO CAPS: 50000.0000 [IU] | ORAL_CAPSULE | ORAL | 0 refills | Status: DC

## 2022-10-13 NOTE — Telephone Encounter (Signed)
Requested medication (s) are due for refill today:   Not sure  Requested medication (s) are on the active medication list:   Valtrex is historical;     Vit. D is from another provider  Future visit scheduled:   Yes   Last ordered: Valtrex from historical provider on list, in chart it's from Dr. Linwood Dibbles;    Vitamin D 04/12/2022 #4, 0 refills from [facility] provider.  Reported that she is not taking it as of 10/07/2022.  Returned due to being non delegated for Vitamin D.  Provider to review Valtrex.   Requested Prescriptions  Pending Prescriptions Disp Refills   valACYclovir (VALTREX) 1000 MG tablet       Antimicrobials:  Antiviral Agents - Anti-Herpetic Passed - 10/13/2022 10:03 AM      Passed - Valid encounter within last 12 months    Recent Outpatient Visits           6 days ago Sore throat   [place] [facility] [place], [place], PA-C   1 week ago Midline back pain, unspecified back location, unspecified chronicity   [place] [facility] [place], [place], PA-C   2 months ago Type 2 diabetes mellitus with other specified complication, without long-term current use of insulin [facility])   [facility] [place], Marzella Schlein, MD   3 months ago Ginette Pitman   [facility], Marzella Schlein, MD   8 months ago Elevated LFTs   [facility] Caro Laroche, DO       Future Appointments             In 3 months Bacigalupo, Marzella Schlein, MD [facility], [facility]             Vitamin D, Ergocalciferol, (DRISDOL) 1.25 MG (50000 UNIT) CAPS capsule 4 capsule 0    Sig: Take 1 capsule (50,000 Units total) by mouth every 7 (seven) days.     Endocrinology:  Vitamins - Vitamin D Supplementation 2 Failed - 10/13/2022 10:03 AM      Failed - Manual Review: Route requests for 50,000 IU strength to the provider      Failed - Vitamin  D in normal range and within 360 days    Vit D, 25-Hydroxy  Date Value Ref Range Status  02/10/2022 28.0 (L) 30.0 - 100.0 ng/mL Final    Comment:    Vitamin D deficiency has been defined by the Institute of Medicine and an Endocrine Society practice guideline as a level of serum 25-OH vitamin D less than 20 ng/mL (1,2). The Endocrine Society went on to further define vitamin D insufficiency as a level between 21 and 29 ng/mL (2). 1. IOM (Institute of Medicine). 2010. Dietary reference    intakes for calcium and D. [place]: The    [employer]. 2. Holick MF, Binkley Roff, Bischoff-Ferrari HA, et al.    Evaluation, treatment, and prevention of vitamin D    deficiency: an Endocrine Society clinical practice    guideline. JCEM. 2011 Jul; 96(7):1911-30.          Passed - Ca in normal range and within 360 days    Calcium  Date Value Ref Range Status  07/29/2022 9.6 8.7 - 10.2 mg/dL Final   Calcium, Total  Date Value Ref Range Status  11/17/2011 8.8 8.5 - 10.1 mg/dL Final         Passed -  Valid encounter within last 12 months    Recent Outpatient Visits           6 days ago Sore throat   [facility] [place], [place], [place]   1 week ago Midline back pain, unspecified back location, unspecified chronicity   [place] [facility] [place], [place], PA-C   2 months ago Type 2 diabetes mellitus with other specified complication, without long-term current use of insulin [facility])   [facility] [place], Marzella Schlein, MD   3 months ago Ginette Pitman   [facility], Marzella Schlein, MD   8 months ago Elevated LFTs   [facility] Caro Laroche, DO       Future Appointments             In 3 months Bacigalupo, Marzella Schlein, MD [facility], [facility]

## 2022-10-13 NOTE — Telephone Encounter (Signed)
Medication Refill - Medication: valACYclovir (VALTREX) 1000 MG tablet   Has the patient contacted their pharmacy? No.  Preferred Pharmacy (with phone number or street name):  [id number] [address] Phone: [phone]  Fax: [phone]     Has the patient been seen for an appointment in the last year OR does the patient have an upcoming appointment? No.  Agent: Please be advised that RX refills may take up to 3 business days. We ask that you follow-up with your pharmacy.  Patient says if she doesn't take the meds right away it will be pointless as she will have blisters all over her body but then. Please advise she has called back to f/u on refill

## 2022-10-13 NOTE — Telephone Encounter (Signed)
Copied from CRM [id number]. Topic: General - Other >> Oct 13, 2022  8:30 AM Everette C wrote: Reason for CRM: Medication Refill - Medication: valACYclovir (VALTREX) 1000 MG tablet [[id number]]  Vitamin D, Ergocalciferol, (DRISDOL) 1.25 MG (50000 UNIT) CAPS capsule [[id number]]  Has the patient contacted their pharmacy? No. (Agent: If no, request that the patient contact the pharmacy for the refill. If patient does not wish to contact the pharmacy document the reason why and proceed with request.) (Agent: If yes, when and what did the pharmacy advise?)  Preferred Pharmacy (with phone number or street name): [phone] [address] Halim [address] [place] [address] Phone: [phone] Fax: [phone] Hours: Not open 24 hours   Has the patient been seen for an appointment in the last year OR does the patient have an upcoming appointment? Yes.    Agent: Please be advised that RX refills may take up to 3 business days. We ask that you follow-up with your pharmacy.

## 2022-10-23 ENCOUNTER — Other Ambulatory Visit: Payer: Self-pay | Admitting: Family Medicine

## 2022-10-23 DIAGNOSIS — I10 Essential (primary) hypertension: Secondary | ICD-10-CM

## 2022-10-26 NOTE — Telephone Encounter (Signed)
Requested Prescriptions  Pending Prescriptions Disp Refills   losartan-hydrochlorothiazide (HYZAAR) 50-12.5 MG tablet [Pharmacy Med Name: LOSARTAN-HCTZ 50-12.5 MG TAB] 90 tablet 1    Sig: TAKE 1 TABLET BY MOUTH EVERY DAY     Cardiovascular: ARB + Diuretic Combos Passed - 10/23/2022  8:45 AM      Passed - K in normal range and within 180 days    Potassium  Date Value Ref Range Status  07/29/2022 5.0 3.5 - 5.2 mmol/L Final  11/17/2011 4.2 3.5 - 5.1 mmol/L Final         Passed - Na in normal range and within 180 days    Sodium  Date Value Ref Range Status  07/29/2022 140 134 - 144 mmol/L Final  11/17/2011 140 136 - 145 mmol/L Final         Passed - Cr in normal range and within 180 days    Creatinine  Date Value Ref Range Status  11/17/2011 0.68 0.60 - 1.30 mg/dL Final   Creatinine, Ser  Date Value Ref Range Status  07/29/2022 0.63 0.57 - 1.00 mg/dL Final         Passed - eGFR is 10 or above and within 180 days    EGFR (African American)  Date Value Ref Range Status  11/17/2011 >60  Final   GFR calc Af Amer  Date Value Ref Range Status  07/01/2020 125 >59 mL/min/1.73 Final    Comment:    **In accordance with recommendations from the NKF-ASN Task force,**   [facility] is in the process of updating its eGFR calculation to the   2021 CKD-EPI creatinine equation that estimates kidney function   without a race variable.    EGFR (Non-African Amer.)  Date Value Ref Range Status  11/17/2011 >60  Final    Comment:    eGFR values <68mL/min/1.73 m2 may be an indication of chronic kidney disease (CKD). Calculated eGFR is useful in patients with stable renal function. The eGFR calculation will not be reliable in acutely ill patients when serum creatinine is changing rapidly. It is not useful in  patients on dialysis. The eGFR calculation may not be applicable to patients at the low and high extremes of body sizes, pregnant women, and vegetarians.    GFR calc non Af Amer  Date  Value Ref Range Status  07/01/2020 109 >59 mL/min/1.73 Final   eGFR  Date Value Ref Range Status  07/29/2022 107 >59 mL/min/1.73 Final         Passed - Patient is not pregnant      Passed - Last BP in normal range    BP Readings from Last 1 Encounters:  10/01/22 126/81         Passed - Valid encounter within last 6 months    Recent Outpatient Visits           2 weeks ago Sore throat   [place] [facility] [place], [place], PA-C   3 weeks ago Midline back pain, unspecified back location, unspecified chronicity   [place] [place], [place], PA-C   2 months ago Type 2 diabetes mellitus with other specified complication, without long-term current use of insulin [facility])   [place] [facility] [place], Marzella Schlein, MD   4 months ago Ginette Pitman   [facility] [place], Marzella Schlein, MD   9 months ago Elevated LFTs   [facility] Caro Lar[place], [place]  Future Appointments             In 3 months Bacigalupo, Marzella Schlein, MD [facility]

## 2022-11-03 ENCOUNTER — Ambulatory Visit
Admission: RE | Admit: 2022-11-03 | Discharge: 2022-11-03 | Disposition: A | Discharge status: Home / Self Care | Payer: No Typology Code available for payment source | Source: Ambulatory Visit | Attending: Family Medicine | Admitting: Family Medicine

## 2022-11-03 DIAGNOSIS — Z1231 Encounter for screening mammogram for malignant neoplasm of breast: Secondary | ICD-10-CM

## 2022-11-04 ENCOUNTER — Other Ambulatory Visit: Payer: Self-pay | Admitting: Family Medicine

## 2022-11-04 DIAGNOSIS — E559 Vitamin D deficiency, unspecified: Secondary | ICD-10-CM

## 2022-11-04 NOTE — Telephone Encounter (Signed)
Requested medications are due for refill today.  yes  Requested medications are on the active medications list.  yes  Last refill. 10/13/2022   Future visit scheduled.   yes  Notes to clinic.  Refill not delegated.    Requested Prescriptions  Pending Prescriptions Disp Refills   Vitamin D, Ergocalciferol, (DRISDOL) 1.25 MG (50000 UNIT) CAPS capsule [Pharmacy Med Name: VITAMIN D2 1.25MG (50,000 UNIT)] 12 capsule 1    Sig: Take 1 capsule (50,000 Units total) by mouth every 7 (seven) days.     Endocrinology:  Vitamins - Vitamin D Supplementation 2 Failed - 11/04/2022  1:31 PM      Failed - Manual Review: Route requests for 50,000 IU strength to the provider      Failed - Vitamin D in normal range and within 360 days    Vit D, 25-Hydroxy  Date Value Ref Range Status  02/10/2022 28.0 (L) 30.0 - 100.0 ng/mL Final    Comment:    Vitamin D deficiency has been defined by the Institute of Medicine and an Endocrine Society practice guideline as a level of serum 25-OH vitamin D less than 20 ng/mL (1,2). The Endocrine Society went on to further define vitamin D insufficiency as a level between 21 and 29 ng/mL (2). 1. IOM (Institute of Medicine). 2010. Dietary reference    intakes for calcium and D. [place]: The    [employer]. 2. Holick MF, Binkley [place], Bischoff-Ferrari HA, et al.    Evaluation, treatment, and prevention of vitamin D    deficiency: an Endocrine Society clinical practice    guideline. JCEM. 2011 Jul; 96(7):1911-30.          Passed - Ca in normal range and within 360 days    Calcium  Date Value Ref Range Status  07/29/2022 9.6 8.7 - 10.2 mg/dL Final   Calcium, Total  Date Value Ref Range Status  11/17/2011 8.8 8.5 - 10.1 mg/dL Final         Passed - Valid encounter within last 12 months    Recent Outpatient Visits           4 weeks ago Sore throat   [place] [facility] [place], [place], PA-C   1 month ago Midline back pain,  unspecified back location, unspecified chronicity   [place] [facility] [place], [place], PA-C   3 months ago Type 2 diabetes mellitus with other specified complication, without long-term current use of insulin [facility])    [facility] [place], Marzella Schlein, MD   4 months ago Ginette Pitman   [facility], Marzella Schlein, MD   9 months ago Elevated LFTs   [facility] Caro Laroche, DO       Future Appointments             In 2 months Bacigalupo, Marzella Schlein, MD [facility]

## 2022-11-05 ENCOUNTER — Other Ambulatory Visit: Payer: Self-pay | Admitting: Family Medicine

## 2022-11-05 NOTE — Telephone Encounter (Signed)
Requested Prescriptions  Pending Prescriptions Disp Refills   valACYclovir (VALTREX) 1000 MG tablet [Pharmacy Med Name: VALACYCLOVIR HCL 1 GRAM TABLET] 180 tablet 0    Sig: TAKE TWO TABLETS EVERY 12 HOURS FOR ONE DAY AS ONSET OF COLD SORE SYMPTOMS.     Antimicrobials:  Antiviral Agents - Anti-Herpetic Passed - 11/05/2022 10:31 AM      Passed - Valid encounter within last 12 months    Recent Outpatient Visits           4 weeks ago Sore throat   [place] [facility] [place], [place], PA-C   1 month ago Midline back pain, unspecified back location, unspecified chronicity   [place] [facility] [place], [place], PA-C   3 months ago Type 2 diabetes mellitus with other specified complication, without long-term current use of insulin [facility])   [place] [facility] [place], Marzella Schlein, MD   4 months ago Ginette Pitman   [facility], Marzella Schlein, MD   9 months ago Elevated LFTs   [facility] Caro Laroche, DO       Future Appointments             In 2 months Bacigalupo, Marzella Schlein, MD [facility], [facility]

## 2022-12-08 ENCOUNTER — Other Ambulatory Visit: Payer: Self-pay | Admitting: Family Medicine

## 2022-12-22 ENCOUNTER — Other Ambulatory Visit: Payer: Self-pay | Admitting: Family Medicine

## 2023-01-27 ENCOUNTER — Ambulatory Visit (INDEPENDENT_AMBULATORY_CARE_PROVIDER_SITE_OTHER): Payer: No Typology Code available for payment source | Admitting: Family Medicine

## 2023-01-27 ENCOUNTER — Encounter: Payer: Self-pay | Admitting: Family Medicine

## 2023-01-27 VITALS — BP 130/80 | HR 75 | Temp 98.2°F | Resp 12 | Ht 62.0 in | Wt 240.0 lb

## 2023-01-27 DIAGNOSIS — Z0001 Encounter for general adult medical examination with abnormal findings: Secondary | ICD-10-CM | POA: Diagnosis not present

## 2023-01-27 DIAGNOSIS — E1169 Type 2 diabetes mellitus with other specified complication: Secondary | ICD-10-CM | POA: Diagnosis not present

## 2023-01-27 DIAGNOSIS — E1159 Type 2 diabetes mellitus with other circulatory complications: Secondary | ICD-10-CM | POA: Diagnosis not present

## 2023-01-27 DIAGNOSIS — I152 Hypertension secondary to endocrine disorders: Secondary | ICD-10-CM

## 2023-01-27 DIAGNOSIS — E559 Vitamin D deficiency, unspecified: Secondary | ICD-10-CM

## 2023-01-27 DIAGNOSIS — Z0000 Encounter for general adult medical examination without abnormal findings: Secondary | ICD-10-CM

## 2023-01-27 DIAGNOSIS — E785 Hyperlipidemia, unspecified: Secondary | ICD-10-CM

## 2023-01-27 NOTE — Assessment & Plan Note (Signed)
Patient stopped taking Crestor 5mg  due to migraines. Discussed the importance of cholesterol control, especially in the context of diabetes. -Check lipid panel today. -Consider alternative statin if lipid panel is elevated.

## 2023-01-27 NOTE — Assessment & Plan Note (Signed)
Patient has difficulty remembering to take weekly Vitamin D supplement. -Check Vitamin D level today. -Consider daily Vitamin D supplement depending on lab results.

## 2023-01-27 NOTE — Assessment & Plan Note (Signed)
Well controlled with diet. Discussed the importance of regular eye exams due to diabetes. -Check A1C today. -Encouraged to schedule an eye exam.

## 2023-01-27 NOTE — Assessment & Plan Note (Signed)
Well controlled on manual recheck Continue current medications Recheck metabolic panel F/u in 6 months  

## 2023-01-27 NOTE — Progress Notes (Signed)
Complete physical exam  Patient: Hannah Roberson   DOB: December 19, 1970   52 y.o. Female  MRN: [id number]  Subjective:    Chief Complaint  Patient presents with   Annual Exam    Patient reports feeling well. Declined flu vaccine and shingles vaccine.     Hannah Roberson is a 52 y.o. female who presents today for a complete physical exam. She reports consuming a general diet.  She generally feels well. She reports sleeping well. She does not have additional problems to discuss today.    Discussed the use of AI scribe software for clinical note transcription with the patient, who gave verbal consent to proceed.  History of Present Illness   The patient, with a history of diabetes associated with HTN and high cholesterol, and vitamin D deficiency, presents for a physical examination. She expresses concern about recent weight gain, attributing it to menopause. She has plans to start exercising and changing her diet to address this issue. She also mentions having hearing aids and noticing a significant improvement in her hearing. She acknowledges the need for an eye exam, especially considering her diabetes. She expresses a willingness to get a shingles shot but has concerns about potential side effects. She also discusses her family history of diabetes, noting that she is the only one not on medication for it. She has made dietary changes, such as reducing red meat and fried food intake, to manage her high cholesterol. She expresses difficulty remembering to take her once-weekly vitamin D supplement and would prefer a daily dose.       Most recent fall risk assessment:    01/27/2023    9:33 AM  Fall Risk   Falls in the past year? 0  Number falls in past yr: 0  Injury with Fall? 0  Risk for fall due to : No Fall Risks  Follow up Falls evaluation completed     Most recent depression screenings:    01/27/2023    9:33 AM 10/01/2022   10:34 AM  PHQ 2/9 Scores  PHQ - 2 Score 0 0  PHQ- 9  Score 0 0        Patient Care Team: Erasmo Downer, MD as PCP - General (Family Medicine)   Outpatient Medications Prior to Visit  Medication Sig   albuterol (PROAIR HFA) 108 (90 Base) MCG/ACT inhaler Inhale 2 puffs into the lungs every 6 (six) hours as needed for wheezing or shortness of breath.   cyclobenzaprine (FLEXERIL) 5 MG tablet Take 1 tablet (5 mg total) by mouth 3 (three) times daily as needed for muscle spasms.   flunisolide (NASALIDE) 25 MCG/ACT (0.025%) SOLN PLACE 2 SPRAYS INTO THE NOSE 2 (TWO) TIMES DAILY.   losartan-hydrochlorothiazide (HYZAAR) 50-12.5 MG tablet TAKE 1 TABLET BY MOUTH EVERY DAY   meloxicam (MOBIC) 15 MG tablet TAKE 1 TABLET (15 MG TOTAL) BY MOUTH DAILY.   valACYclovir (VALTREX) 1000 MG tablet TAKE TWO TABLETS EVERY 12 HOURS FOR ONE DAY AS ONSET OF COLD SORE SYMPTOMS.   Vitamin D, Ergocalciferol, (DRISDOL) 1.25 MG (50000 UNIT) CAPS capsule TAKE 1 CAPSULE (50,000 UNITS TOTAL) BY MOUTH EVERY 7 (SEVEN) DAYS   [DISCONTINUED] rosuvastatin (CRESTOR) 5 MG tablet Take 1 tablet (5 mg total) by mouth daily.   No facility-administered medications prior to visit.    ROS per HPI     Objective:     BP 130/80 (BP Location: Left Arm, Patient Position: Sitting, Cuff Size: Large)   Pulse 75  Temp 98.2 F (36.8 C) (Temporal)   Resp 12   Ht 5\' 2"  (1.575 m)   Wt 240 lb (108.9 kg)   SpO2 98%   BMI 43.90 kg/m    Physical Exam Vitals reviewed.  Constitutional:      General: She is not in acute distress.    Appearance: Normal appearance. She is well-developed. She is not diaphoretic.  HENT:     Head: Normocephalic and atraumatic.     Right Ear: Tympanic membrane, ear canal and external ear normal.     Left Ear: Tympanic membrane, ear canal and external ear normal.     Nose: Nose normal.     Mouth/Throat:     Mouth: Mucous membranes are moist.     Pharynx: Oropharynx is clear. No oropharyngeal exudate.  Eyes:     General: No scleral icterus.     Conjunctiva/sclera: Conjunctivae normal.     Pupils: Pupils are equal, round, and reactive to light.  Neck:     Thyroid: No thyromegaly.  Cardiovascular:     Rate and Rhythm: Normal rate and regular rhythm.     Heart sounds: Normal heart sounds. No murmur heard. Pulmonary:     Effort: Pulmonary effort is normal. No respiratory distress.     Breath sounds: Normal breath sounds. No wheezing or rales.  Abdominal:     General: There is no distension.     Palpations: Abdomen is soft.     Tenderness: There is no abdominal tenderness.  Musculoskeletal:        General: No deformity.     Cervical back: Neck supple.     Right lower leg: No edema.     Left lower leg: No edema.  Lymphadenopathy:     Cervical: No cervical adenopathy.  Skin:    General: Skin is warm and dry.     Findings: No rash.  Neurological:     Mental Status: She is alert and oriented to person, place, and time. Mental status is at baseline.     Gait: Gait normal.  Psychiatric:        Mood and Affect: Mood normal.        Behavior: Behavior normal.        Thought Content: Thought content normal.      No results found for any visits on 01/27/23.     Assessment & Plan:    Routine Health Maintenance and Physical Exam  Immunization History  Administered Date(s) Administered   Hep A / Hep B 10/15/2004   Td 07/01/2020    Health Maintenance  Topic Date Due   OPHTHALMOLOGY EXAM  Never done   Zoster Vaccines- Shingrix (1 of 2) Never done   COVID-19 Vaccine (1 - 2023-24 season) Never done   HEMOGLOBIN A1C  01/27/2023   INFLUENZA VACCINE  08/29/2023 (Originally 12/30/2022)   Diabetic kidney evaluation - eGFR measurement  07/29/2023   Diabetic kidney evaluation - Urine ACR  07/29/2023   FOOT EXAM  07/29/2023   PAP SMEAR-Modifier  06/30/2024   MAMMOGRAM  11/02/2024   Colonoscopy  08/15/2026   DTaP/Tdap/Td (2 - Tdap) 07/01/2030   Hepatitis C Screening  Completed   HIV Screening  Completed   HPV VACCINES  Aged Out     Discussed health benefits of physical activity, and encouraged her to engage in regular exercise appropriate for her age and condition.  Problem List Items Addressed This Visit       Cardiovascular and Mediastinum   Hypertension associated  with diabetes (HCC)    Well controlled on manual recheck Continue current medications Recheck metabolic panel F/u in 6 months       Relevant Orders   Comprehensive metabolic panel     Endocrine   Diabetes mellitus (HCC)    Well controlled with diet. Discussed the importance of regular eye exams due to diabetes. -Check A1C today. -Encouraged to schedule an eye exam.      Relevant Orders   Hemoglobin A1c   Hyperlipidemia associated with type 2 diabetes mellitus (HCC)    Patient stopped taking Crestor 5mg  due to migraines. Discussed the importance of cholesterol control, especially in the context of diabetes. -Check lipid panel today. -Consider alternative statin if lipid panel is elevated.      Relevant Orders   Comprehensive metabolic panel   Lipid panel     Other   Morbid obesity (HCC)    Patient reports weight gain and difficulty with lifestyle modifications. Discussed the importance of diet and exercise. -Encouraged to continue efforts to reduce red meat intake and increase physical activity.      Vitamin D deficiency    Patient has difficulty remembering to take weekly Vitamin D supplement. -Check Vitamin D level today. -Consider daily Vitamin D supplement depending on lab results.      Relevant Orders   VITAMIN D 25 Hydroxy (Vit-D Deficiency, Fractures)   Other Visit Diagnoses     Encounter for annual physical exam    -  Primary   Relevant Orders   Hemoglobin A1c   Comprehensive metabolic panel   Lipid panel   VITAMIN D 25 Hydroxy (Vit-D Deficiency, Fractures)           Hearing Loss Patient recently started using hearing aids and is adjusting well. -No further action needed at this time.  General Health  Maintenance -Mammogram, Pap smear, and colonoscopy are up to date. -advised on availability of flu and COVID booster next month. -Encouraged to schedule shingles vaccine at a time when she can rest afterwards. -Recheck blood pressure today due to initial elevated reading. -Schedule follow-up in 6 months.       Return in about 6 months (around 07/29/2023) for chronic disease f/u.     Shirlee Latch, MD

## 2023-01-27 NOTE — Assessment & Plan Note (Signed)
Patient reports weight gain and difficulty with lifestyle modifications. Discussed the importance of diet and exercise. -Encouraged to continue efforts to reduce red meat intake and increase physical activity.

## 2023-01-28 LAB — COMPREHENSIVE METABOLIC PANEL WITH GFR
ALT: 99 [IU]/L — ABNORMAL HIGH (ref 0–32)
AST: 83 [IU]/L — ABNORMAL HIGH (ref 0–40)
Albumin: 4.2 g/dL (ref 3.8–4.9)
Alkaline Phosphatase: 209 [IU]/L — ABNORMAL HIGH (ref 44–121)
BUN/Creatinine Ratio: 20 (ref 9–23)
BUN: 12 mg/dL (ref 6–24)
Bilirubin Total: 0.3 mg/dL (ref 0.0–1.2)
CO2: 21 mmol/L (ref 20–29)
Calcium: 10.1 mg/dL (ref 8.7–10.2)
Chloride: 102 mmol/L (ref 96–106)
Creatinine, Ser: 0.61 mg/dL (ref 0.57–1.00)
Globulin, Total: 3.1 g/dL (ref 1.5–4.5)
Glucose: 173 mg/dL — ABNORMAL HIGH (ref 70–99)
Potassium: 4.4 mmol/L (ref 3.5–5.2)
Sodium: 139 mmol/L (ref 134–144)
Total Protein: 7.3 g/dL (ref 6.0–8.5)
eGFR: 108 mL/min/{1.73_m2}

## 2023-01-28 LAB — HEMOGLOBIN A1C
Est. average glucose Bld gHb Est-mCnc: 163 mg/dL
Hgb A1c MFr Bld: 7.3 % — ABNORMAL HIGH (ref 4.8–5.6)

## 2023-01-28 LAB — LIPID PANEL
Chol/HDL Ratio: 4.2 ratio (ref 0.0–4.4)
Cholesterol, Total: 210 mg/dL — ABNORMAL HIGH (ref 100–199)
HDL: 50 mg/dL (ref >39)
LDL Chol Calc (NIH): 130 mg/dL — ABNORMAL HIGH (ref 0–99)
Triglycerides: 166 mg/dL — ABNORMAL HIGH (ref 0–149)
VLDL Cholesterol Cal: 30 mg/dL (ref 5–40)

## 2023-01-28 LAB — VITAMIN D 25 HYDROXY (VIT D DEFICIENCY, FRACTURES): Vit D, 25-Hydroxy: 27.7 ng/mL — ABNORMAL LOW (ref 30.0–100.0)

## 2023-02-01 ENCOUNTER — Other Ambulatory Visit: Payer: Self-pay

## 2023-02-01 DIAGNOSIS — E1169 Type 2 diabetes mellitus with other specified complication: Secondary | ICD-10-CM

## 2023-02-01 MED ORDER — ATORVASTATIN CALCIUM 40 MG PO TABS
40.0000 mg | ORAL_TABLET | Freq: Every day | ORAL | 0 refills | Status: DC
Start: 2023-02-01 — End: 2023-02-23

## 2023-02-23 ENCOUNTER — Other Ambulatory Visit: Payer: Self-pay | Admitting: Family Medicine

## 2023-02-23 DIAGNOSIS — E1169 Type 2 diabetes mellitus with other specified complication: Secondary | ICD-10-CM

## 2023-04-27 ENCOUNTER — Other Ambulatory Visit: Payer: Self-pay | Admitting: Family Medicine

## 2023-04-27 DIAGNOSIS — I10 Essential (primary) hypertension: Secondary | ICD-10-CM

## 2023-04-27 NOTE — Telephone Encounter (Signed)
Requested Prescriptions  Pending Prescriptions Disp Refills   losartan-hydrochlorothiazide (HYZAAR) 50-12.5 MG tablet [Pharmacy Med Name: LOSARTAN-HCTZ 50-12.5 MG TAB] 90 tablet 1    Sig: TAKE 1 TABLET BY MOUTH EVERY DAY     Cardiovascular: ARB + Diuretic Combos Passed - 04/27/2023  1:25 AM      Passed - K in normal range and within 180 days    Potassium  Date Value Ref Range Status  01/27/2023 4.4 3.5 - 5.2 mmol/L Final  11/17/2011 4.2 3.5 - 5.1 mmol/L Final         Passed - Na in normal range and within 180 days    Sodium  Date Value Ref Range Status  01/27/2023 139 134 - 144 mmol/L Final  11/17/2011 140 136 - 145 mmol/L Final         Passed - Cr in normal range and within 180 days    Creatinine  Date Value Ref Range Status  11/17/2011 0.68 0.60 - 1.30 mg/dL Final   Creatinine, Ser  Date Value Ref Range Status  01/27/2023 0.61 0.57 - 1.00 mg/dL Final         Passed - eGFR is 10 or above and within 180 days    EGFR (African American)  Date Value Ref Range Status  11/17/2011 >60  Final   GFR calc Af Amer  Date Value Ref Range Status  07/01/2020 125 >59 mL/min/1.73 Final    Comment:    **In accordance with recommendations from the NKF-ASN Task force,**   [facility] is in the process of updating its eGFR calculation to the   2021 CKD-EPI creatinine equation that estimates kidney function   without a race variable.    EGFR (Non-African Amer.)  Date Value Ref Range Status  11/17/2011 >60  Final    Comment:    eGFR values <73mL/min/1.73 m2 may be an indication of chronic kidney disease (CKD). Calculated eGFR is useful in patients with stable renal function. The eGFR calculation will not be reliable in acutely ill patients when serum creatinine is changing rapidly. It is not useful in  patients on dialysis. The eGFR calculation may not be applicable to patients at the low and high extremes of body sizes, pregnant women, and vegetarians.    GFR calc non Af Amer   Date Value Ref Range Status  07/01/2020 109 >59 mL/min/1.73 Final   eGFR  Date Value Ref Range Status  01/27/2023 108 >59 mL/min/1.73 Final         Passed - Patient is not pregnant      Passed - Last BP in normal range    BP Readings from Last 1 Encounters:  01/27/23 130/80         Passed - Valid encounter within last 6 months    Recent Outpatient Visits           3 months ago Encounter for annual physical exam   [place] [facility] [place], Marzella Schlein, MD   6 months ago Sore throat   [facility] [place], [place], PA-C   6 months ago Midline back pain, unspecified back location, unspecified chronicity   [facility] [place], [place], PA-C   9 months ago Type 2 diabetes mellitus with other specified complication, without long-term current use of insulin [facility])   [place] [facility] [place], Marzella Schlein, MD   10 months ago Ginette Pitman   [facility] [place], Marzella Schlein, MD  Future Appointments             In 1 week Bacigalupo, Marzella Schlein, MD [facility], [facility]

## 2023-04-30 ENCOUNTER — Other Ambulatory Visit: Payer: Self-pay | Admitting: Family Medicine

## 2023-04-30 DIAGNOSIS — J4520 Mild intermittent asthma, uncomplicated: Secondary | ICD-10-CM

## 2023-05-09 ENCOUNTER — Ambulatory Visit: Payer: No Typology Code available for payment source | Admitting: Family Medicine

## 2023-05-09 ENCOUNTER — Encounter: Payer: Self-pay | Admitting: Family Medicine

## 2023-05-09 ENCOUNTER — Telehealth: Payer: Self-pay | Admitting: Family Medicine

## 2023-05-09 VITALS — BP 132/80 | HR 85 | Resp 16 | Ht 62.0 in | Wt 237.4 lb

## 2023-05-09 DIAGNOSIS — I152 Hypertension secondary to endocrine disorders: Secondary | ICD-10-CM

## 2023-05-09 DIAGNOSIS — E1159 Type 2 diabetes mellitus with other circulatory complications: Secondary | ICD-10-CM | POA: Diagnosis not present

## 2023-05-09 DIAGNOSIS — E559 Vitamin D deficiency, unspecified: Secondary | ICD-10-CM

## 2023-05-09 DIAGNOSIS — F411 Generalized anxiety disorder: Secondary | ICD-10-CM

## 2023-05-09 DIAGNOSIS — E1169 Type 2 diabetes mellitus with other specified complication: Secondary | ICD-10-CM | POA: Diagnosis not present

## 2023-05-09 DIAGNOSIS — E785 Hyperlipidemia, unspecified: Secondary | ICD-10-CM

## 2023-05-09 LAB — POCT GLYCOSYLATED HEMOGLOBIN (HGB A1C)
Est. average glucose Bld gHb Est-mCnc: 217
Hemoglobin A1C: 9.2 % — AB (ref 4.0–5.6)

## 2023-05-09 MED ORDER — TIRZEPATIDE 2.5 MG/0.5ML ~~LOC~~ SOAJ: 2.5000 mg | SUBCUTANEOUS | 0 refills | Status: DC

## 2023-05-09 MED ORDER — TIRZEPATIDE 5 MG/0.5ML ~~LOC~~ SOAJ: 5.0000 mg | SUBCUTANEOUS | 1 refills | Status: DC

## 2023-05-09 NOTE — Telephone Encounter (Signed)
Recieved a fax from covermymeds for Mounjaro 5MG /0.5ML Auto-injectors has been started   OZH:[id number]

## 2023-05-09 NOTE — Assessment & Plan Note (Signed)
Currently on atorvastatin. Discussed potential impact of dietary changes on cholesterol levels. - Continue atorvastatin as prescribed

## 2023-05-09 NOTE — Assessment & Plan Note (Signed)
Currently on losartan HCTZ. Blood pressure is well-controlled. - Continue losartan HCTZ as prescribed

## 2023-05-09 NOTE — Progress Notes (Signed)
Established Patient Office Visit  Subjective   Patient ID: Hannah Roberson, female    DOB: 09-Feb-1971  Age: 52 y.o. MRN: [id number]  Chief Complaint  Patient presents with   Medical Management of Chronic Issues    T2DM    HPI  Discussed the use of AI scribe software for clinical note transcription with the patient, who gave verbal consent to proceed.  History of Present Illness   The patient, with a history of hypertension and hyperlipidemia, presents for a follow-up visit primarily concerning their diabetes management. They report feeling exhausted due to recent work-related travels. Their HbA1c levels have been increasing, from 6 to 7.3, and most recently to 9.2. They are not currently on any medication for diabetes. They have been considering a carnivore diet as a potential management strategy for their diabetes, but have not yet implemented this change. They also mention an issue with a previous inhaler prescription that had expired.         ROS    Objective:     BP 132/80 (BP Location: Left Arm, Patient Position: Sitting, Cuff Size: Large)   Pulse 85   Resp 16   Ht 5\' 2"  (1.575 m)   Wt 237 lb 6.4 oz (107.7 kg)   SpO2 97%   BMI 43.42 kg/m    Physical Exam Vitals reviewed.  Constitutional:      General: She is not in acute distress.    Appearance: Normal appearance. She is well-developed. She is not diaphoretic.  HENT:     Head: Normocephalic and atraumatic.  Eyes:     General: No scleral icterus.    Conjunctiva/sclera: Conjunctivae normal.  Neck:     Thyroid: No thyromegaly.  Cardiovascular:     Rate and Rhythm: Normal rate and regular rhythm.     Heart sounds: Normal heart sounds. No murmur heard. Pulmonary:     Effort: Pulmonary effort is normal. No respiratory distress.     Breath sounds: Normal breath sounds. No wheezing, rhonchi or rales.  Musculoskeletal:     Cervical back: Neck supple.     Right lower leg: No edema.     Left lower leg: No  edema.  Lymphadenopathy:     Cervical: No cervical adenopathy.  Skin:    General: Skin is warm and dry.     Findings: No rash.  Neurological:     Mental Status: She is alert and oriented to person, place, and time. Mental status is at baseline.  Psychiatric:        Mood and Affect: Mood normal.        Behavior: Behavior normal.      Results for orders placed or performed in visit on 05/09/23  POCT glycosylated hemoglobin (Hb A1C)  Result Value Ref Range   Hemoglobin A1C 9.2 (A) 4.0 - 5.6 %   Est. average glucose Bld gHb Est-mCnc 217       The 10-year ASCVD risk score (Arnett DK, et al., 2019) is: 4.4%    Assessment & Plan:   Problem List Items Addressed This Visit       Cardiovascular and Mediastinum   Hypertension associated with diabetes (HCC) - Primary    Currently on losartan HCTZ. Blood pressure is well-controlled. - Continue losartan HCTZ as prescribed      Relevant Medications   tirzepatide (MOUNJARO) 2.5 MG/0.5ML Pen   tirzepatide (MOUNJARO) 5 MG/0.5ML Pen     Endocrine   Diabetes mellitus (HCC)    Significant increase in  HbA1c from 7.3% to 9.2% over the past three months. Discussed importance of managing blood glucose to prevent complications. Patient interested in the carnivore diet but advised about risks, including increased cholesterol and lack of fiber. Discussed medication options: metformin, Ozempic, Jardiance, and Mounjaro. Patient prefers Greggory Keen due to concerns about metformin side effects. Explained Mounjaro's benefits for weight loss, A1c reduction, and cardiovascular protection. - Prescribe Mounjaro 2.5 mg weekly for one month, then increase to 5 mg weekly - [facility] [place] - Follow-up in three months to reassess HbA1c and adjust dose if needed      Relevant Medications   tirzepatide (MOUNJARO) 2.5 MG/0.5ML Pen   tirzepatide (MOUNJARO) 5 MG/0.5ML Pen   Other Relevant Orders   POCT glycosylated hemoglobin (Hb A1C)  (Completed)   Hyperlipidemia associated with type 2 diabetes mellitus (HCC)    Currently on atorvastatin. Discussed potential impact of dietary changes on cholesterol levels. - Continue atorvastatin as prescribed      Relevant Medications   tirzepatide (MOUNJARO) 2.5 MG/0.5ML Pen   tirzepatide (MOUNJARO) 5 MG/0.5ML Pen     Other   Morbid obesity (HCC)    Discussed importance of healthy weight management Discussed diet and exercise       Relevant Medications   tirzepatide (MOUNJARO) 2.5 MG/0.5ML Pen   tirzepatide (MOUNJARO) 5 MG/0.5ML Pen   Vitamin D deficiency   RESOLVED: GAD (generalized anxiety disorder)       General Health Maintenance Discussed importance of regular eye exams. Patient has eye insurance expiring at the end of the year. - Schedule an eye exam before year-end  Follow-up - Follow-up in three months to reassess HbA1c and medication efficacy.       Return in about 3 months (around 08/07/2023) for chronic disease f/u.    Shirlee Latch, MD

## 2023-05-09 NOTE — Patient Instructions (Signed)
Make an eye doctor appt

## 2023-05-09 NOTE — Assessment & Plan Note (Signed)
Discussed importance of healthy weight management Discussed diet and exercise  

## 2023-05-09 NOTE — Assessment & Plan Note (Signed)
Significant increase in HbA1c from 7.3% to 9.2% over the past three months. Discussed importance of managing blood glucose to prevent complications. Patient interested in the carnivore diet but advised about risks, including increased cholesterol and lack of fiber. Discussed medication options: metformin, Ozempic, Jardiance, and Mounjaro. Patient prefers Greggory Keen due to concerns about metformin side effects. Explained Mounjaro's benefits for weight loss, A1c reduction, and cardiovascular protection. - Prescribe Mounjaro 2.5 mg weekly for one month, then increase to 5 mg weekly - [facility] [place] - Follow-up in three months to reassess HbA1c and adjust dose if needed

## 2023-05-09 NOTE — Telephone Encounter (Signed)
Received a fax from covermymeds for Mounjaro 2.5mg /0.56ml  Key: [id number]

## 2023-05-11 NOTE — Telephone Encounter (Signed)
Received a fax from covermymeds for mounjaro 5mg /0.79ml  Key:  [id number]  This is the correct code.  Looks like the original one put in was missing a letter

## 2023-05-11 NOTE — Telephone Encounter (Signed)
Pt called reporting that the pharmacy is waiting on this authorization before they will give her the Rx. Please advise

## 2023-05-18 NOTE — Telephone Encounter (Signed)
PA started

## 2023-05-19 NOTE — Telephone Encounter (Signed)
PA sent

## 2023-06-14 ENCOUNTER — Other Ambulatory Visit: Payer: Self-pay | Admitting: Family Medicine

## 2023-06-14 ENCOUNTER — Telehealth: Payer: Self-pay | Admitting: Family Medicine

## 2023-06-14 NOTE — Telephone Encounter (Signed)
 Medication Refill -  Most Recent Primary Care Visit:  Provider: MYRLA JON HERO  Department: [facility]  Visit Type: OFFICE VISIT  Date: 05/09/2023  Medication: tirzepatide  (MOUNJARO ) 5 MG/0.5ML Pen  Pt is due for next injection on Friday, unable to request from pharmacy as further action is required - pt has one refill left for the 5mg  but can't fill at pharmacy. Pt states there are also no refills on the 2.5 mg prescription.   Has the patient contacted their pharmacy? Yes  Is this the correct pharmacy for this prescription? Yes If no, delete pharmacy and type the correct one.  This is the patient's preferred pharmacy:  [id number] [place] [place] - [address] [place] [address] Phone: [phone] Fax: [phone]  Has the prescription been filled recently? No  Is the patient out of the medication? Yes  Has the patient been seen for an appointment in the last year OR does the patient have an upcoming appointment? Yes  Can we respond through MyChart? Yes  Agent: Please be advised that Rx refills may take up to 3 business days. We ask that you follow-up with your pharmacy.

## 2023-06-15 NOTE — Telephone Encounter (Signed)
 Contacted pharmacy and they verified rx has refills on file Reports patient can receive with $75 copay Advised to fill and contact patient as she has called the office for refill

## 2023-06-15 NOTE — Telephone Encounter (Signed)
 Requested medication (s) are due for refill today: na   Requested medication (s) are on the active medication list: yes   Last refill:  05/09/23 #6 ml 0 refills  Future visit scheduled: yes in 1 month  Notes to clinic:  medication not assigned to a protocol. Do you want to refill Rx?     Requested Prescriptions  Pending Prescriptions Disp Refills   MOUNJARO 2.5 MG/0.5ML Pen [Pharmacy Med Name: MOUNJARO 2.5 MG/0.5 ML PEN]      Sig: INJECT 2.5 MG SUBCUTANEOUSLY WEEKLY     Off-Protocol Failed - 06/15/2023 12:50 PM      Failed - Medication not assigned to a protocol, review manually.      Passed - Valid encounter within last 12 months    Recent Outpatient Visits           1 month ago Hypertension associated with diabetes [facility])   [place] [facility] [place], Stan Eans, MD   4 months ago Encounter for annual physical exam   [place] [facility] [place], Stan Eans, MD   8 months ago Sore throat   [place] [place], [place], PA-C   8 months ago Midline back pain, unspecified back location, unspecified chronicity   [place] [facility] [place], [place], PA-C   10 months ago Type 2 diabetes mellitus with other specified complication, without long-term current use of insulin  [facility])   [place] [facility] Bacigalupo, Stan Eans, MD       Future Appointments             In 1 month Bacigalupo, Stan Eans, MD [facility], [facility]

## 2023-06-20 MED ORDER — TIRZEPATIDE 5 MG/0.5ML ~~LOC~~ SOAJ: 5.0000 mg | SUBCUTANEOUS | 0 refills | Status: DC

## 2023-07-23 ENCOUNTER — Other Ambulatory Visit: Payer: Self-pay | Admitting: Family Medicine

## 2023-07-23 DIAGNOSIS — I10 Essential (primary) hypertension: Secondary | ICD-10-CM

## 2023-07-25 NOTE — Telephone Encounter (Signed)
 Requested Prescriptions  Pending Prescriptions Disp Refills   losartan-hydrochlorothiazide (HYZAAR) 50-12.5 MG tablet [Pharmacy Med Name: LOSARTAN-HCTZ 50-12.5 MG TAB] 90 tablet 0    Sig: TAKE 1 TABLET BY MOUTH EVERY DAY     Cardiovascular: ARB + Diuretic Combos Passed - 07/25/2023  2:01 PM      Passed - K in normal range and within 180 days    Potassium  Date Value Ref Range Status  01/27/2023 4.4 3.5 - 5.2 mmol/L Final  11/17/2011 4.2 3.5 - 5.1 mmol/L Final         Passed - Na in normal range and within 180 days    Sodium  Date Value Ref Range Status  01/27/2023 139 134 - 144 mmol/L Final  11/17/2011 140 136 - 145 mmol/L Final         Passed - Cr in normal range and within 180 days    Creatinine  Date Value Ref Range Status  11/17/2011 0.68 0.60 - 1.30 mg/dL Final   Creatinine, Ser  Date Value Ref Range Status  01/27/2023 0.61 0.57 - 1.00 mg/dL Final         Passed - eGFR is 10 or above and within 180 days    EGFR (African American)  Date Value Ref Range Status  11/17/2011 >60  Final   GFR calc Af Amer  Date Value Ref Range Status  07/01/2020 125 >59 mL/min/1.73 Final    Comment:    **In accordance with recommendations from the NKF-ASN Task force,**   [facility] is in the process of updating its eGFR calculation to the   2021 CKD-EPI creatinine equation that estimates kidney function   without a race variable.    EGFR (Non-African Amer.)  Date Value Ref Range Status  11/17/2011 >60  Final    Comment:    eGFR values <52mL/min/1.73 m2 may be an indication of chronic kidney disease (CKD). Calculated eGFR is useful in patients with stable renal function. The eGFR calculation will not be reliable in acutely ill patients when serum creatinine is changing rapidly. It is not useful in  patients on dialysis. The eGFR calculation may not be applicable to patients at the low and high extremes of body sizes, pregnant women, and vegetarians.    GFR calc non Af Amer  Date  Value Ref Range Status  07/01/2020 109 >59 mL/min/1.73 Final   eGFR  Date Value Ref Range Status  01/27/2023 108 >59 mL/min/1.73 Final         Passed - Patient is not pregnant      Passed - Last BP in normal range    BP Readings from Last 1 Encounters:  05/09/23 132/80         Passed - Valid encounter within last 6 months    Recent Outpatient Visits           2 months ago Hypertension associated with diabetes [facility])   [facility] [place], Marzella Schlein, MD   5 months ago Encounter for annual physical exam   [place] [facility] Erasmo Downer, MD   9 months ago Sore throat   [place] [facility] [place], Dorothy, PA-C   9 months ago Midline back pain, unspecified back location, unspecified chronicity   [place] [facility] [place], [place], PA-C   12 months ago Type 2 diabetes mellitus with other specified complication, without long-term current use of insulin [facility])   [facility] [place], [place],  MD       Future Appointments             In 2 weeks Bacigalupo, Marzella Schlein, MD [facility], [facility]

## 2023-08-08 ENCOUNTER — Encounter: Payer: Self-pay | Admitting: Family Medicine

## 2023-08-08 ENCOUNTER — Ambulatory Visit: Payer: Self-pay | Admitting: Family Medicine

## 2023-08-08 VITALS — BP 121/83 | HR 76 | Ht 62.0 in | Wt 214.0 lb

## 2023-08-08 DIAGNOSIS — E785 Hyperlipidemia, unspecified: Secondary | ICD-10-CM

## 2023-08-08 DIAGNOSIS — I152 Hypertension secondary to endocrine disorders: Secondary | ICD-10-CM

## 2023-08-08 DIAGNOSIS — E1159 Type 2 diabetes mellitus with other circulatory complications: Secondary | ICD-10-CM | POA: Diagnosis not present

## 2023-08-08 DIAGNOSIS — E1169 Type 2 diabetes mellitus with other specified complication: Secondary | ICD-10-CM | POA: Diagnosis not present

## 2023-08-08 DIAGNOSIS — E559 Vitamin D deficiency, unspecified: Secondary | ICD-10-CM | POA: Diagnosis not present

## 2023-08-08 NOTE — Assessment & Plan Note (Signed)
 Completed 12 weeks of high-dose vitamin D. Currently taking over-the-counter daily vitamin D supplement. - Order vitamin D level

## 2023-08-08 NOTE — Assessment & Plan Note (Signed)
 Well-controlled with current medication regimen. Blood pressure within normal range. - Continue losartan HCTZ 50-12.5 mg daily

## 2023-08-08 NOTE — Assessment & Plan Note (Signed)
 Chronic condition managed with Lipitor 40 mg daily. Discussed long-term use of cholesterol medication for heart disease prevention. Weight loss and blood sugar control may reduce the need for antihypertensive medication but not necessarily for cholesterol medication. - Continue Lipitor 40 mg daily - Order cholesterol test

## 2023-08-08 NOTE — Assessment & Plan Note (Signed)
 Chronic condition. Reports good glycemic control with Mounjaro 5 mg weekly, significant weight loss (23 lbs since December). Experiencing heartburn, managed by elevating the head of the bed. Adheres to a diet with an apple daily for fiber. Last A1c was 9.2, aiming to reduce it below 7. Foot exam normal, sensation intact, no ulcers or decreased pulses. Discussed potential dose increase if weight loss plateaus or food noise returns. - Continue Mounjaro 5 mg weekly - Order A1c, cholesterol, kidney and liver function tests, and vitamin D levels - Perform urine test for protein levels - Follow-up in six months for physical

## 2023-08-08 NOTE — Assessment & Plan Note (Signed)
 Significant weight loss (23 lbs since December). Reports reduced appetite and food noise with Mounjaro. Discussed potential dose increase if weight loss plateaus or food noise returns. Emphasized importance of protein intake to prevent hair loss. - Continue Mounjaro 5 mg weekly - Monitor weight and food noise, consider dose increase if plateau occurs

## 2023-08-08 NOTE — Progress Notes (Signed)
 Established patient visit   Patient: Hannah Roberson   DOB: [birth year]   53 y.o. Female  MRN: [id number] Visit Date: 08/08/2023  Today's healthcare provider: Shirlee Latch, MD   Chief Complaint  Patient presents with   Medical Management of Chronic Issues    Patient reports taking medications as prescribed with no symptoms to report   Diabetes    Patient does not monitor at home. Recently completed eye exam. Does not feel sugar is ever low   Hyperlipidemia   Hypertension    Patient does not monitor at home   Care Management    Pneumococcal Vaccine declined Zoster Vaccines declined   Subjective    Diabetes  Hyperlipidemia  Hypertension   HPI     Medical Management of Chronic Issues    Additional comments: Patient reports taking medications as prescribed with no symptoms to report        Diabetes    Additional comments: Patient does not monitor at home. Recently completed eye exam. Does not feel sugar is ever low        Hypertension    Additional comments: Patient does not monitor at home        Care Management    Additional comments: Pneumococcal Vaccine declined Zoster Vaccines declined      Last edited by Acey Lav, CMA on 08/08/2023  9:01 AM.       Discussed the use of AI scribe software for clinical note transcription with the patient, who gave verbal consent to proceed.  History of Present Illness   The patient, a 53 year old with hypertension, type two diabetes, hyperlipidemia, vitamin D deficiency, and morbid obesity, presents for a routine follow-up. The patient reports worsening heartburn, a side effect she attributes to her current medication regimen. Despite this, she has not reported any other adverse effects from her medications.  The patient has been experiencing significant weight loss, with a reported loss of 23 pounds since her last visit in December. She attributes this weight loss to her current medication,  Mounjaro, which she takes at a dose of 5mg  weekly. The patient reports that the medication effectively suppresses her appetite, to the point where she sometimes struggles to eat. She has noticed a slight increase in her hunger levels in the days leading up to her weekly dose, but overall, she feels the medication is still working well.  In addition to her prescribed medications, the patient has been taking over-the-counter vitamins D, C, and B12. She completed a 12-week course of high-dose vitamin D and is now taking a daily supplement. She also reports taking a vitamin C chewable and a B12 supplement.  The patient has a history of smoking but quit ten years ago. She has a family history of addiction and has chosen to abstain from alcohol. She also reports a genetic predisposition to hair thinning, which she is managing by ensuring she gets enough protein in her diet.         Medications: Outpatient Medications Prior to Visit  Medication Sig   albuterol (VENTOLIN HFA) 108 (90 Base) MCG/ACT inhaler TAKE 2 PUFFS BY MOUTH EVERY 6 HOURS AS NEEDED FOR WHEEZE OR SHORTNESS OF BREATH   atorvastatin (LIPITOR) 40 MG tablet TAKE 1 TABLET BY MOUTH EVERY DAY   cyclobenzaprine (FLEXERIL) 5 MG tablet Take 1 tablet (5 mg total) by mouth 3 (three) times daily as needed for muscle spasms.   flunisolide (NASALIDE) 25 MCG/ACT (0.025%) SOLN PLACE 2 SPRAYS INTO THE  NOSE 2 (TWO) TIMES DAILY.   losartan-hydrochlorothiazide (HYZAAR) 50-12.5 MG tablet TAKE 1 TABLET BY MOUTH EVERY DAY   meloxicam (MOBIC) 15 MG tablet TAKE 1 TABLET (15 MG TOTAL) BY MOUTH DAILY.   tirzepatide [facility]) 5 MG/0.5ML Pen Inject 5 mg into the skin once a week.   valACYclovir (VALTREX) 1000 MG tablet TAKE TWO TABLETS EVERY 12 HOURS FOR ONE DAY AS ONSET OF COLD SORE SYMPTOMS.   [DISCONTINUED] tirzepatide [facility]) 2.5 MG/0.5ML Pen Inject 2.5 mg into the skin once a week.   [DISCONTINUED] Vitamin D, Ergocalciferol, (DRISDOL) 1.25 MG (50000 UNIT)  CAPS capsule TAKE 1 CAPSULE (50,000 UNITS TOTAL) BY MOUTH EVERY 7 (SEVEN) DAYS   No facility-administered medications prior to visit.    Review of Systems     Objective    BP 121/83 (BP Location: Left Arm, Patient Position: Sitting, Cuff Size: Large)   Pulse 76   Ht 5\' 2"  (1.575 m)   Wt 214 lb (97.1 kg)   BMI 39.14 kg/m    Physical Exam Vitals reviewed.  Constitutional:      General: She is not in acute distress.    Appearance: Normal appearance. She is well-developed. She is not diaphoretic.  HENT:     Head: Normocephalic and atraumatic.  Eyes:     General: No scleral icterus.    Conjunctiva/sclera: Conjunctivae normal.  Neck:     Thyroid: No thyromegaly.  Cardiovascular:     Rate and Rhythm: Normal rate and regular rhythm.     Heart sounds: Normal heart sounds. No murmur heard. Pulmonary:     Effort: Pulmonary effort is normal. No respiratory distress.     Breath sounds: Normal breath sounds. No wheezing, rhonchi or rales.  Musculoskeletal:     Cervical back: Neck supple.     Right lower leg: No edema.     Left lower leg: No edema.  Lymphadenopathy:     Cervical: No cervical adenopathy.  Skin:    General: Skin is warm and dry.     Findings: No rash.  Neurological:     Mental Status: She is alert and oriented to person, place, and time. Mental status is at baseline.  Psychiatric:        Mood and Affect: Mood normal.        Behavior: Behavior normal.      No results found for any visits on 08/08/23.  Assessment & Plan     Problem List Items Addressed This Visit       Cardiovascular and Mediastinum   Hypertension associated with diabetes (HCC) - Primary   Well-controlled with current medication regimen. Blood pressure within normal range. - Continue losartan HCTZ 50-12.5 mg daily      Relevant Orders   Comprehensive metabolic panel     Endocrine   Diabetes mellitus (HCC)   Chronic condition. Reports good glycemic control with Mounjaro 5 mg weekly,  significant weight loss (23 lbs since December). Experiencing heartburn, managed by elevating the head of the bed. Adheres to a diet with an apple daily for fiber. Last A1c was 9.2, aiming to reduce it below 7. Foot exam normal, sensation intact, no ulcers or decreased pulses. Discussed potential dose increase if weight loss plateaus or food noise returns. - Continue Mounjaro 5 mg weekly - Order A1c, cholesterol, kidney and liver function tests, and vitamin D levels - Perform urine test for protein levels - Follow-up in six months for physical      Relevant Orders   Urine  microalbumin-creatinine with uACR   Hemoglobin A1c   Hyperlipidemia associated with type 2 diabetes mellitus (HCC)   Chronic condition managed with Lipitor 40 mg daily. Discussed long-term use of cholesterol medication for heart disease prevention. Weight loss and blood sugar control may reduce the need for antihypertensive medication but not necessarily for cholesterol medication. - Continue Lipitor 40 mg daily - Order cholesterol test      Relevant Orders   Lipid Panel With LDL/HDL Ratio     Other   Morbid obesity (HCC)   Significant weight loss (23 lbs since December). Reports reduced appetite and food noise with Mounjaro. Discussed potential dose increase if weight loss plateaus or food noise returns. Emphasized importance of protein intake to prevent hair loss. - Continue Mounjaro 5 mg weekly - Monitor weight and food noise, consider dose increase if plateau occurs      Relevant Orders   Comprehensive metabolic panel   Vitamin D deficiency   Completed 12 weeks of high-dose vitamin D. Currently taking over-the-counter daily vitamin D supplement. - Order vitamin D level      Relevant Orders   VITAMIN D 25 Hydroxy (Vit-D Deficiency, Fractures)        General Health Maintenance Due for routine screenings and tests. - Request last eye exam report - Perform foot exam - Order A1c, cholesterol, kidney and  liver function tests, and vitamin D levels  Follow-up - Follow-up in six months for physical - Notify patient of lab results via MyChart.       Return in about 6 months (around 02/08/2024) for CPE.       Hannah Latch, MD  [facility] [phone] (phone) [phone] (fax)  [facility]

## 2023-08-09 ENCOUNTER — Encounter: Payer: Self-pay | Admitting: Family Medicine

## 2023-08-09 ENCOUNTER — Other Ambulatory Visit: Payer: Self-pay

## 2023-08-09 DIAGNOSIS — R7989 Other specified abnormal findings of blood chemistry: Secondary | ICD-10-CM

## 2023-08-09 LAB — COMPREHENSIVE METABOLIC PANEL
ALT: 49 IU/L — ABNORMAL HIGH (ref 0–32)
AST: 38 IU/L (ref 0–40)
Albumin: 4.2 g/dL (ref 3.8–4.9)
Alkaline Phosphatase: 385 IU/L — ABNORMAL HIGH (ref 44–121)
BUN/Creatinine Ratio: 24 — ABNORMAL HIGH (ref 9–23)
BUN: 17 mg/dL (ref 6–24)
Bilirubin Total: 0.6 mg/dL (ref 0.0–1.2)
CO2: 22 mmol/L (ref 20–29)
Calcium: 10.1 mg/dL (ref 8.7–10.2)
Chloride: 101 mmol/L (ref 96–106)
Creatinine, Ser: 0.72 mg/dL (ref 0.57–1.00)
Globulin, Total: 2.9 g/dL (ref 1.5–4.5)
Glucose: 116 mg/dL — ABNORMAL HIGH (ref 70–99)
Potassium: 4.1 mmol/L (ref 3.5–5.2)
Sodium: 139 mmol/L (ref 134–144)
Total Protein: 7.1 g/dL (ref 6.0–8.5)
eGFR: 101 mL/min/{1.73_m2} (ref >59)

## 2023-08-09 LAB — LIPID PANEL WITH LDL/HDL RATIO
Cholesterol, Total: 87 mg/dL — ABNORMAL LOW (ref 100–199)
HDL: 32 mg/dL — ABNORMAL LOW (ref >39)
LDL Chol Calc (NIH): 32 mg/dL (ref 0–99)
LDL/HDL Ratio: 1 ratio (ref 0.0–3.2)
Triglycerides: 133 mg/dL (ref 0–149)
VLDL Cholesterol Cal: 23 mg/dL (ref 5–40)

## 2023-08-09 LAB — HEMOGLOBIN A1C
Est. average glucose Bld gHb Est-mCnc: 123 mg/dL
Hgb A1c MFr Bld: 5.9 % — ABNORMAL HIGH (ref 4.8–5.6)

## 2023-08-09 LAB — MICROALBUMIN / CREATININE URINE RATIO
Creatinine, Urine: 134.2 mg/dL
Microalb/Creat Ratio: 8 mg/g{creat} (ref 0–29)
Microalbumin, Urine: 10.7 ug/mL

## 2023-08-09 LAB — VITAMIN D 25 HYDROXY (VIT D DEFICIENCY, FRACTURES): Vit D, 25-Hydroxy: 40.6 ng/mL (ref 30.0–100.0)

## 2023-08-15 ENCOUNTER — Ambulatory Visit
Admission: RE | Admit: 2023-08-15 | Discharge: 2023-08-15 | Disposition: A | Discharge status: Home / Self Care | Source: Ambulatory Visit | Attending: Family Medicine | Admitting: Family Medicine

## 2023-08-15 DIAGNOSIS — R7989 Other specified abnormal findings of blood chemistry: Secondary | ICD-10-CM | POA: Diagnosis present

## 2023-09-04 ENCOUNTER — Other Ambulatory Visit: Payer: Self-pay | Admitting: Family Medicine

## 2023-09-04 DIAGNOSIS — E1169 Type 2 diabetes mellitus with other specified complication: Secondary | ICD-10-CM

## 2023-09-28 ENCOUNTER — Encounter: Payer: Self-pay | Admitting: Family Medicine

## 2023-09-29 NOTE — Telephone Encounter (Signed)
 Ok to change mounjaro to 7.5 mg weekly dose 90 day Rx with 1 refill.  She can keep her CPE in 9/25 as next appt

## 2023-09-30 MED ORDER — TIRZEPATIDE 7.5 MG/0.5ML ~~LOC~~ SOAJ: 7.5000 mg | SUBCUTANEOUS | 1 refills | Status: DC

## 2023-10-12 ENCOUNTER — Telehealth: Payer: Self-pay

## 2023-10-12 NOTE — Telephone Encounter (Signed)
 Patient's husband has been advised that we have no record of patient having Tdap vaccine.  Appointment scheduled for patient   Copied from CRM [id number]. Topic: Clinical - Medical Advice >> Oct 12, 2023 12:34 PM Phil Braun wrote: Reason for CRM:   Ref TDAP  Can you please check and see when pt had her last tdap. She is expecting a grandchild and needs one if not recent. I see that the office has to schedule appt. Please call pt and advise.

## 2023-10-13 ENCOUNTER — Ambulatory Visit (INDEPENDENT_AMBULATORY_CARE_PROVIDER_SITE_OTHER): Admitting: Family Medicine

## 2023-10-13 DIAGNOSIS — Z23 Encounter for immunization: Secondary | ICD-10-CM

## 2023-10-13 NOTE — Progress Notes (Signed)
 Patient only seen for administration of Tdap vaccine.  Patient not seen or evaluated by physician.

## 2023-10-30 ENCOUNTER — Other Ambulatory Visit: Payer: Self-pay | Admitting: Family Medicine

## 2023-10-30 DIAGNOSIS — I10 Essential (primary) hypertension: Secondary | ICD-10-CM

## 2023-12-09 ENCOUNTER — Encounter: Payer: Self-pay | Admitting: Family Medicine

## 2023-12-10 ENCOUNTER — Encounter: Payer: Self-pay | Admitting: Emergency Medicine

## 2023-12-10 ENCOUNTER — Ambulatory Visit
Admission: EM | Admit: 2023-12-10 | Discharge: 2023-12-10 | Disposition: A | Discharge status: Home / Self Care | Attending: Family Medicine | Admitting: Family Medicine

## 2023-12-10 DIAGNOSIS — S61412A Laceration without foreign body of left hand, initial encounter: Secondary | ICD-10-CM | POA: Diagnosis not present

## 2023-12-10 MED ORDER — FLUCONAZOLE 150 MG PO TABS: 150.0000 mg | ORAL_TABLET | Freq: Once | ORAL | 0 refills | Status: AC

## 2023-12-10 MED ORDER — CEPHALEXIN 500 MG PO CAPS: 500.0000 mg | ORAL_CAPSULE | Freq: Three times a day (TID) | ORAL | 0 refills | Status: DC

## 2023-12-10 NOTE — Discharge Instructions (Addendum)
 Stop by the pharmacy to pick up your prescriptions.  The skin glue will gradually lift over the next week.  Watch for signs and symptoms of infection like fever worsening redness or increasing pain. See handout for more information.

## 2023-12-10 NOTE — ED Provider Notes (Signed)
 [facility]    CSN: [id number] Arrival date & time: 12/10/23  1402      History   Chief Complaint Chief Complaint  Patient presents with   Extremity Laceration    Right 2nd finger    HPI Hannah Roberson is a 53 y.o. female.   HPI  Hannah Roberson presents for right index finger laceration that occurred at home about 30 minutes prior to arrival. She was washing a knife while rushing to cook for her guests.    She is right handed.      Past Medical History:  Diagnosis Date   Allergic rhinitis    Anxiety    Asthma, extrinsic    inhaler but not needed often   Esophagitis, reflux    GERD (gastroesophageal reflux disease)    Hypertension    Liver problem     Patient Active Problem List   Diagnosis Date Noted   Elevated LFTs 02/24/2022   Vitamin D  deficiency 02/24/2022   Hyperlipidemia associated with type 2 diabetes mellitus (HCC) 02/24/2022   SOBOE (shortness of breath on exertion) 02/10/2022   Other fatigue 02/10/2022   Diabetes mellitus (HCC) 02/10/2022   Polyp of colon    Stiffness of left shoulder joint 06/17/2021   Hypertension associated with diabetes (HCC) 02/25/2020   Menorrhagia with irregular cycle 10/22/2019   Overactive bladder 09/27/2014   Morbid obesity (HCC) 09/27/2014   Allergic rhinitis 09/23/2006   Asthma, exogenous 09/23/2006   Esophagitis, reflux 09/23/2006    Past Surgical History:  Procedure Laterality Date   Bile duct obstruction     patient has had 2 surgeries on liver to obstruction    CHOLECYSTECTOMY     COLONOSCOPY WITH PROPOFOL  N/A 08/14/2021   Procedure: COLONOSCOPY WITH PROPOFOL ;  Surgeon: Jinny Carmine, MD;  [facility];  Service: Endoscopy;  Laterality: N/A;   POLYPECTOMY  08/14/2021   Procedure: POLYPECTOMY;  Surgeon: Jinny Carmine, MD;  Location: [facility];  Service: Endoscopy;;   TUBAL LIGATION  1998    OB History   No obstetric history on file.      Home Medications    Prior to  Admission medications   Medication Sig Start Date End Date Taking? Authorizing Provider  cephALEXin  (KEFLEX ) 500 MG capsule Take 1 capsule (500 mg total) by mouth 3 (three) times daily. 12/10/23  Yes Beatriz Quintela, DO  fluconazole  (DIFLUCAN ) 150 MG tablet Take 1 tablet (150 mg total) by mouth once for 1 dose. 12/10/23 12/10/23 Yes Yanis Juma, DO  albuterol  (VENTOLIN  HFA) 108 (90 Base) MCG/ACT inhaler TAKE 2 PUFFS BY MOUTH EVERY 6 HOURS AS NEEDED FOR WHEEZE OR SHORTNESS OF BREATH 05/02/23   Myrla Jon HERO, MD  atorvastatin  (LIPITOR) 40 MG tablet TAKE 1 TABLET BY MOUTH EVERY DAY 09/05/23   Bacigalupo, Jon HERO, MD  cyclobenzaprine  (FLEXERIL ) 5 MG tablet Take 1 tablet (5 mg total) by mouth 3 (three) times daily as needed for muscle spasms. 07/29/22   Bacigalupo, Angela M, MD  flunisolide (NASALIDE) 25 MCG/ACT (0.025%) SOLN PLACE 2 SPRAYS INTO THE NOSE 2 (TWO) TIMES DAILY. 07/26/22   Bacigalupo, Angela M, MD  losartan -hydrochlorothiazide  (HYZAAR) 50-12.5 MG tablet TAKE 1 TABLET BY MOUTH EVERY DAY 10/31/23   Pardue, Lauraine SAILOR, DO  tirzepatide  (MOUNJARO ) 7.5 MG/0.5ML Pen Inject 7.5 mg into the skin once a week. 09/30/23   Bacigalupo, Angela M, MD  valACYclovir  (VALTREX ) 1000 MG tablet TAKE TWO TABLETS EVERY 12 HOURS FOR ONE DAY AS ONSET OF COLD SORE  SYMPTOMS. 12/08/22   Myrla Jon HERO, MD    Family History Family History  Problem Relation Age of Onset   Hypertension Mother    Diabetes Mother    Anxiety disorder Mother    Obesity Mother    Diabetes Sister    Diabetes Brother    Cancer Maternal Grandfather        lung cancer   Breast cancer Maternal Aunt     Social History Social History   Tobacco Use   Smoking status: Former    Current packs/day: 0.00    Average packs/day: 1 pack/day for 10.0 years (10.0 ttl pk-yrs)    Types: Cigarettes    Start date: 09/28/2003    Quit date: 09/27/2013    Years since quitting: 10.2   Smokeless tobacco: Never  Substance Use Topics   Alcohol use:  Not Currently   Drug use: No     Allergies   Metronidazole    Review of Systems Review of Systems :negative unless otherwise stated in HPI.      Physical Exam Triage Vital Signs ED Triage Vitals  Encounter Vitals Group     BP 12/10/23 1417 (!) 140/65     Girls Systolic BP Percentile --      Girls Diastolic BP Percentile --      Boys Systolic BP Percentile --      Boys Diastolic BP Percentile --      Pulse Rate 12/10/23 1417 68     Resp 12/10/23 1417 14     Temp 12/10/23 1417 98.1 F (36.7 C)     Temp Source 12/10/23 1417 Oral     SpO2 12/10/23 1417 98 %     Weight 12/10/23 1415 214 lb 1.1 oz (97.1 kg)     Height 12/10/23 1415 5' 2 (1.575 m)     Head Circumference --      Peak Flow --      Pain Score 12/10/23 1414 2     Pain Loc --      Pain Education --      Exclude from Growth Chart --    No data found.  Updated Vital Signs BP (!) 140/65 (BP Location: Left Arm)   Pulse 68   Temp 98.1 F (36.7 C) (Oral)   Resp 14   Ht 5' 2 (1.575 m)   Wt 97.1 kg   LMP 10/29/2023 (Approximate)   SpO2 98%   BMI 39.15 kg/m   Visual Acuity Right Eye Distance:   Left Eye Distance:   Bilateral Distance:    Right Eye Near:   Left Eye Near:    Bilateral Near:     Physical Exam  GEN: alert, well appearing female, in no acute distress  CV: regular rate, radial pulse negative  RESP: no increased work of breathing MSK: Right hand: No swelling in PIP, DIP joints b/l. Flexor digitorum profundus and superficialis tendon functions are intact.  PIP joint collateral ligaments are stable  NEURO: alert, moves all extremities appropriately SKIN: warm and dry; 1.5 cm right index finger laceration   UC Treatments / Results  Labs (all labs ordered are listed, but only abnormal results are displayed) Labs Reviewed - No data to display  EKG   Radiology No results found.  Procedures Laceration Repair  Date/Time: 12/10/2023 3:58 PM  Performed by: Duaa Stelzner,  DO Authorized by: Kriste Berth, DO   Consent:    Consent obtained:  Verbal   Consent given by:  Patient   Risks,  benefits, and alternatives were discussed: yes   Universal protocol:    Patient identity confirmed:  Verbally with patient Anesthesia:    Anesthesia method:  None Laceration details:    Location:  Finger   Finger location:  R index finger   Length (cm):  1.5   Depth (mm):  2 Exploration:    Hemostasis achieved with:  Tourniquet Treatment:    Area cleansed with:  Chlorhexidine and saline   Amount of cleaning:  Standard Skin repair:    Repair method:  Tissue adhesive Approximation:    Approximation:  Close Repair type:    Repair type:  Simple Post-procedure details:    Dressing:  Non-adherent dressing   Procedure completion:  Tolerated  (including critical care time)  Medications Ordered in UC Medications - No data to display  Initial Impression / Assessment and Plan / UC Course  I have reviewed the triage vital signs and the nursing notes.  Pertinent labs & imaging results that were available during my care of the patient were reviewed by me and considered in my medical decision making (see chart for details).     Pt is a 53 y.o. female who presents after injuring right index finger at home just prior to arrival. Linear laceration was repaired patient tolerated procedure well. Discussed with patient that we do not have suture kits here while at the check in desk.  They are on back-order. She is agreeable to try Dermabond and accepts the risk of it reopening and needing to heal by secondary intention.  Wound cleansed and closed with Dermabond.  Home care instructions provided. OTC analgesics as needed for pain. Antibiotics for prophylaxis as below. Pt's last tentaus was Feb 2022.        Final Clinical Impressions(s) / UC Diagnoses   Final diagnoses:  Laceration of left hand, foreign body presence unspecified, initial encounter     Discharge  Instructions      Stop by the pharmacy to pick up your prescriptions.  The skin glue will gradually lift over the next week.  Watch for signs and symptoms of infection like fever worsening redness or increasing pain. See handout for more information.       ED Prescriptions     Medication Sig Dispense Auth. Provider   cephALEXin  (KEFLEX ) 500 MG capsule Take 1 capsule (500 mg total) by mouth 3 (three) times daily. 21 capsule Daleiza Bacchi, DO   fluconazole  (DIFLUCAN ) 150 MG tablet Take 1 tablet (150 mg total) by mouth once for 1 dose. 1 tablet Kriste Berth, DO      PDMP not reviewed this encounter.              Pleasant Britz, DO 12/10/23 1559

## 2023-12-10 NOTE — ED Triage Notes (Signed)
 Patient states that she was cleaning a kitchen knife about 30 min ago and cut the top of her right 2nd finger.  Patient has minimal bleeding at this time.

## 2024-01-30 ENCOUNTER — Other Ambulatory Visit: Payer: Self-pay | Admitting: Family Medicine

## 2024-01-30 DIAGNOSIS — I10 Essential (primary) hypertension: Secondary | ICD-10-CM

## 2024-02-09 ENCOUNTER — Encounter: Admitting: Family Medicine

## 2024-02-16 ENCOUNTER — Encounter: Admitting: Family Medicine

## 2024-02-16 ENCOUNTER — Telehealth: Payer: Self-pay

## 2024-02-16 NOTE — Telephone Encounter (Signed)
Left message to call back to reschedule today's appointment.

## 2024-03-01 ENCOUNTER — Encounter: Payer: Self-pay | Admitting: Family Medicine

## 2024-03-01 ENCOUNTER — Ambulatory Visit: Admitting: Family Medicine

## 2024-03-01 VITALS — BP 131/88 | HR 77 | Ht 62.0 in | Wt 190.3 lb

## 2024-03-01 DIAGNOSIS — E66811 Obesity, class 1: Secondary | ICD-10-CM

## 2024-03-01 DIAGNOSIS — E1169 Type 2 diabetes mellitus with other specified complication: Secondary | ICD-10-CM

## 2024-03-01 DIAGNOSIS — E559 Vitamin D deficiency, unspecified: Secondary | ICD-10-CM | POA: Diagnosis not present

## 2024-03-01 DIAGNOSIS — E1159 Type 2 diabetes mellitus with other circulatory complications: Secondary | ICD-10-CM | POA: Diagnosis not present

## 2024-03-01 DIAGNOSIS — I152 Hypertension secondary to endocrine disorders: Secondary | ICD-10-CM

## 2024-03-01 DIAGNOSIS — Z0000 Encounter for general adult medical examination without abnormal findings: Secondary | ICD-10-CM

## 2024-03-01 DIAGNOSIS — I10 Essential (primary) hypertension: Secondary | ICD-10-CM

## 2024-03-01 DIAGNOSIS — Z1231 Encounter for screening mammogram for malignant neoplasm of breast: Secondary | ICD-10-CM

## 2024-03-01 DIAGNOSIS — E785 Hyperlipidemia, unspecified: Secondary | ICD-10-CM

## 2024-03-01 DIAGNOSIS — Z0001 Encounter for general adult medical examination with abnormal findings: Secondary | ICD-10-CM | POA: Diagnosis not present

## 2024-03-01 MED ORDER — LOSARTAN POTASSIUM-HCTZ 50-12.5 MG PO TABS: 1.0000 | ORAL_TABLET | Freq: Every day | ORAL | 1 refills | Status: AC

## 2024-03-01 MED ORDER — TIRZEPATIDE 7.5 MG/0.5ML ~~LOC~~ SOAJ: 7.5000 mg | SUBCUTANEOUS | 1 refills | Status: AC

## 2024-03-01 MED ORDER — ATORVASTATIN CALCIUM 40 MG PO TABS
40.0000 mg | ORAL_TABLET | Freq: Every day | ORAL | 1 refills | Status: AC
Start: 2024-03-01 — End: ?

## 2024-03-01 NOTE — Assessment & Plan Note (Signed)
 Vitamin D  levels were previously normalized. She has stopped prescription vitamin D  and is unsure about over-the-counter options. - Check vitamin D  levels - Advise on over-the-counter vitamin D  supplementation based on lab results

## 2024-03-01 NOTE — Progress Notes (Signed)
 Complete physical exam   Patient: Hannah Roberson   DOB: [birth year]   53 y.o. Female  MRN: [id number] Visit Date: 03/01/2024  Today's healthcare provider: Jon Eva, MD   Chief Complaint  Patient presents with   Annual Exam    Last completed 08/29*24 Diet -  Low Carb Exercise - walking at least once a week for at least 45 minutes and 8000 steps a day most days Feeling - well Sleeping - well Concerns - none    Subjective    Hannah Roberson is a 53 y.o. female who presents today for a complete physical exam.   Discussed the use of AI scribe software for clinical note transcription with the patient, who gave verbal consent to proceed.  History of Present Illness   Hannah Roberson is a 53 year old female who presents for an annual physical exam.  She is due for a mammogram, with the last one in June 2024, and a Pap smear in January 2026, though her GYN appointments have been canceled twice. She has not received the COVID vaccine and is hesitant about the shingles vaccine due to concerns about side effects.  She previously had a prescription for vitamin D  but is unsure about which over-the-counter vitamin D  to take. She is currently taking losartan  HCTZ, Mounjaro , and atorvastatin , with atorvastatin  taken every other day due to a disrupted routine. Her blood pressure is well-controlled, and she attributes improved health to weight loss, though she has experienced a plateau due to reduced exercise.  Her diet includes cucumbers with vinegar, apples or oranges daily, and a protein shake with black coffee for breakfast. She has a decreased appetite and reduced desire to eat with Mounjaro . She occasionally eats baked potatoes and green beans and tries to limit carb intake.  She feels great overall, with a minor back issue she attributes to aging. She has slowed down on exercise due to spending time with her new grandchild. Her son and daughter-in-law, who recently left the  [employer], are living with her while building a house.       Last depression screening scores    03/01/2024    8:59 AM 01/27/2023    9:33 AM 10/01/2022   10:34 AM  PHQ 2/9 Scores  PHQ - 2 Score 0 0 0  PHQ- 9 Score  0 0   Last fall risk screening    01/27/2023    9:33 AM  Fall Risk   Falls in the past year? 0  Number falls in past yr: 0  Injury with Fall? 0  Risk for fall due to : No Fall Risks  Follow up Falls evaluation completed        Medications: Outpatient Medications Prior to Visit  Medication Sig   albuterol  (VENTOLIN  HFA) 108 (90 Base) MCG/ACT inhaler TAKE 2 PUFFS BY MOUTH EVERY 6 HOURS AS NEEDED FOR WHEEZE OR SHORTNESS OF BREATH (Patient taking differently: prn)   valACYclovir  (VALTREX ) 1000 MG tablet TAKE TWO TABLETS EVERY 12 HOURS FOR ONE DAY AS ONSET OF COLD SORE SYMPTOMS. (Patient taking differently: TAKE TWO TABLETS EVERY 12 HOURS FOR ONE DAY AS ONSET OF COLD SORE SYMPTOMS.)   [DISCONTINUED] atorvastatin  (LIPITOR) 40 MG tablet TAKE 1 TABLET BY MOUTH EVERY DAY   [DISCONTINUED] losartan -hydrochlorothiazide  (HYZAAR) 50-12.5 MG tablet TAKE 1 TABLET BY MOUTH EVERY DAY   [DISCONTINUED] tirzepatide  (MOUNJARO ) 7.5 MG/0.5ML Pen Inject 7.5 mg into the skin once a week.   [DISCONTINUED] cephALEXin  (KEFLEX ) 500 MG  capsule Take 1 capsule (500 mg total) by mouth 3 (three) times daily. (Patient not taking: Reported on 03/01/2024)   [DISCONTINUED] cyclobenzaprine  (FLEXERIL ) 5 MG tablet Take 1 tablet (5 mg total) by mouth 3 (three) times daily as needed for muscle spasms. (Patient not taking: Reported on 03/01/2024)   [DISCONTINUED] flunisolide (NASALIDE) 25 MCG/ACT (0.025%) SOLN PLACE 2 SPRAYS INTO THE NOSE 2 (TWO) TIMES DAILY. (Patient not taking: Reported on 03/01/2024)   No facility-administered medications prior to visit.    Review of Systems    Objective    BP 131/88 (BP Location: Left Arm, Patient Position: Sitting, Cuff Size: Normal)   Pulse 77   Ht 5' 2 (1.575 m)    Wt 190 lb 4.8 oz (86.3 kg)   SpO2 95%   BMI 34.81 kg/m    Physical Exam Vitals reviewed.  Constitutional:      General: She is not in acute distress.    Appearance: Normal appearance. She is well-developed. She is not diaphoretic.  HENT:     Head: Normocephalic and atraumatic.     Right Ear: Tympanic membrane, ear canal and external ear normal.     Left Ear: Tympanic membrane, ear canal and external ear normal.     Nose: Nose normal.     Mouth/Throat:     Mouth: Mucous membranes are moist.     Pharynx: Oropharynx is clear. No oropharyngeal exudate.  Eyes:     General: No scleral icterus.    Conjunctiva/sclera: Conjunctivae normal.     Pupils: Pupils are equal, round, and reactive to light.  Neck:     Thyroid: No thyromegaly.  Cardiovascular:     Rate and Rhythm: Normal rate and regular rhythm.     Heart sounds: Normal heart sounds. No murmur heard. Pulmonary:     Effort: Pulmonary effort is normal. No respiratory distress.     Breath sounds: Normal breath sounds. No wheezing or rales.  Abdominal:     General: There is no distension.     Palpations: Abdomen is soft.     Tenderness: There is no abdominal tenderness.  Musculoskeletal:        General: No deformity.     Cervical back: Neck supple.     Right lower leg: No edema.     Left lower leg: No edema.  Lymphadenopathy:     Cervical: No cervical adenopathy.  Skin:    General: Skin is warm and dry.     Findings: No rash.  Neurological:     Mental Status: She is alert and oriented to person, place, and time. Mental status is at baseline.     Gait: Gait normal.  Psychiatric:        Mood and Affect: Mood normal.        Behavior: Behavior normal.        Thought Content: Thought content normal.      No results found for any visits on 03/01/24.  Assessment & Plan    Routine Health Maintenance and Physical Exam  Exercise Activities and Dietary recommendations  Goals   None     Immunization History   Administered Date(s) Administered   Hep A / Hep B 10/15/2004   Td 07/01/2020   Tdap 10/13/2023    Health Maintenance  Topic Date Due   OPHTHALMOLOGY EXAM  Never done   Pneumococcal Vaccine: 50+ Years (1 of 2 - PCV) Never done   Hepatitis B Vaccines 19-59 Average Risk (2 of 3 - 19+ 3-dose  series) 11/12/2004   Zoster Vaccines- Shingrix (1 of 2) Never done   COVID-19 Vaccine (1 - 2024-25 season) Never done   HEMOGLOBIN A1C  02/08/2024   Influenza Vaccine  08/28/2024 (Originally 12/30/2023)   Cervical Cancer Screening (HPV/Pap Cotest)  06/30/2024   Diabetic kidney evaluation - eGFR measurement  08/07/2024   Diabetic kidney evaluation - Urine ACR  08/07/2024   FOOT EXAM  08/07/2024   Mammogram  11/02/2024   Colonoscopy  08/15/2026   DTaP/Tdap/Td (3 - Td or Tdap) 10/12/2033   Hepatitis C Screening  Completed   HIV Screening  Completed   HPV VACCINES  Aged Out   Meningococcal B Vaccine  Aged Out    Discussed health benefits of physical activity, and encouraged her to engage in regular exercise appropriate for her age and condition.  Problem List Items Addressed This Visit       Cardiovascular and Mediastinum   Hypertension associated with diabetes (HCC)   Reports good blood pressure readings at home and no recent issues. - Continue losartan  HCTZ      Relevant Medications   atorvastatin  (LIPITOR) 40 MG tablet   losartan -hydrochlorothiazide  (HYZAAR) 50-12.5 MG tablet   tirzepatide  (MOUNJARO ) 7.5 MG/0.5ML Pen   Other Relevant Orders   Comprehensive metabolic panel with GFR     Endocrine   Diabetes mellitus (HCC)   Well-managed with Mounjaro . Previous A1c was normal. Reports good weight loss and appetite control with current Mounjaro  dose. - Continue Mounjaro  at current dose - Check A1c      Relevant Medications   atorvastatin  (LIPITOR) 40 MG tablet   losartan -hydrochlorothiazide  (HYZAAR) 50-12.5 MG tablet   tirzepatide  (MOUNJARO ) 7.5 MG/0.5ML Pen   Other Relevant Orders    Hemoglobin A1c   Hyperlipidemia associated with type 2 diabetes mellitus (HCC)   Previously improved with atorvastatin . Reports taking atorvastatin  intermittently, primarily during weekdays. Discussed the importance of consistent dosing for heart disease risk reduction. Intermittent dosing may still provide benefit. - Check cholesterol levels - Continue atorvastatin  - Discussed potential for intermittent dosing based on lab results      Relevant Medications   atorvastatin  (LIPITOR) 40 MG tablet   losartan -hydrochlorothiazide  (HYZAAR) 50-12.5 MG tablet   tirzepatide  (MOUNJARO ) 7.5 MG/0.5ML Pen   Other Relevant Orders   Comprehensive metabolic panel with GFR   Lipid Panel With LDL/HDL Ratio     Other   Obesity (BMI 30.0-34.9)   Managed with Mounjaro , resulting in weight loss and improved well-being. Reports decreased appetite and improved dietary habits. - Continue current dietary habits and Mounjaro       Vitamin D  deficiency   Vitamin D  levels were previously normalized. She has stopped prescription vitamin D  and is unsure about over-the-counter options. - Check vitamin D  levels - Advise on over-the-counter vitamin D  supplementation based on lab results       Relevant Orders   VITAMIN D  25 Hydroxy (Vit-D Deficiency, Fractures)   Other Visit Diagnoses       Encounter for annual physical exam    -  Primary   Relevant Orders   Comprehensive metabolic panel with GFR   Lipid Panel With LDL/HDL Ratio   Hemoglobin A1c   VITAMIN D  25 Hydroxy (Vit-D Deficiency, Fractures)     Breast cancer screening by mammogram       Relevant Orders   MM 3D SCREENING MAMMOGRAM BILATERAL BREAST     Essential hypertension       Relevant Medications   atorvastatin  (LIPITOR) 40 MG tablet  losartan -hydrochlorothiazide  (HYZAAR) 50-12.5 MG tablet           Adult Wellness Visit Blood pressure is well-controlled. She is hesitant about the shingles vaccine due to potential side effects but is  considering it. Discussed pneumonia, flu, and COVID vaccines. - Order mammogram - Advise to call GYN to reschedule Pap smear - Advise on scheduling shingles vaccine at pharmacy - does not want to get today      Return in about 6 months (around 08/30/2024) for chronic disease f/u.     Jon Eva, MD  [facility] [phone] (phone) [phone] (fax)  [facility]

## 2024-03-01 NOTE — Assessment & Plan Note (Signed)
 Managed with Mounjaro , resulting in weight loss and improved well-being. Reports decreased appetite and improved dietary habits. - Continue current dietary habits and Mounjaro 

## 2024-03-01 NOTE — Assessment & Plan Note (Signed)
 Reports good blood pressure readings at home and no recent issues. - Continue losartan  HCTZ

## 2024-03-01 NOTE — Assessment & Plan Note (Signed)
 Well-managed with Mounjaro . Previous A1c was normal. Reports good weight loss and appetite control with current Mounjaro  dose. - Continue Mounjaro  at current dose - Check A1c

## 2024-03-01 NOTE — Patient Instructions (Addendum)
 Call [facility] to schedule a mammogram [phone]   I strongly recommend two doses of Shingrix (the shingles vaccine) separated by 2 to 6 months for adults age 52 years and older. I recommend checking with your insurance plan regarding coverage for this vaccine.

## 2024-03-01 NOTE — Assessment & Plan Note (Signed)
 Previously improved with atorvastatin . Reports taking atorvastatin  intermittently, primarily during weekdays. Discussed the importance of consistent dosing for heart disease risk reduction. Intermittent dosing may still provide benefit. - Check cholesterol levels - Continue atorvastatin  - Discussed potential for intermittent dosing based on lab results

## 2024-03-02 LAB — COMPREHENSIVE METABOLIC PANEL WITH GFR
ALT: 78 IU/L — ABNORMAL HIGH (ref 0–32)
AST: 42 IU/L — ABNORMAL HIGH (ref 0–40)
Albumin: 4.2 g/dL (ref 3.8–4.9)
Alkaline Phosphatase: 244 IU/L — ABNORMAL HIGH (ref 49–135)
BUN/Creatinine Ratio: 33 — ABNORMAL HIGH (ref 9–23)
BUN: 18 mg/dL (ref 6–24)
Bilirubin Total: 0.4 mg/dL (ref 0.0–1.2)
CO2: 21 mmol/L (ref 20–29)
Calcium: 9.9 mg/dL (ref 8.7–10.2)
Chloride: 103 mmol/L (ref 96–106)
Creatinine, Ser: 0.55 mg/dL — ABNORMAL LOW (ref 0.57–1.00)
Globulin, Total: 2.4 g/dL (ref 1.5–4.5)
Glucose: 97 mg/dL (ref 70–99)
Potassium: 4.3 mmol/L (ref 3.5–5.2)
Sodium: 140 mmol/L (ref 134–144)
Total Protein: 6.6 g/dL (ref 6.0–8.5)
eGFR: 110 mL/min/1.73 (ref >59)

## 2024-03-02 LAB — LIPID PANEL WITH LDL/HDL RATIO
Cholesterol, Total: 106 mg/dL (ref 100–199)
HDL: 44 mg/dL (ref >39)
LDL Chol Calc (NIH): 45 mg/dL (ref 0–99)
LDL/HDL Ratio: 1 ratio (ref 0.0–3.2)
Triglycerides: 88 mg/dL (ref 0–149)
VLDL Cholesterol Cal: 17 mg/dL (ref 5–40)

## 2024-03-02 LAB — VITAMIN D 25 HYDROXY (VIT D DEFICIENCY, FRACTURES): Vit D, 25-Hydroxy: 32.9 ng/mL (ref 30.0–100.0)

## 2024-03-02 LAB — HEMOGLOBIN A1C
Est. average glucose Bld gHb Est-mCnc: 103 mg/dL
Hgb A1c MFr Bld: 5.2 % (ref 4.8–5.6)

## 2024-03-05 ENCOUNTER — Ambulatory Visit: Payer: Self-pay | Admitting: Family Medicine

## 2024-03-25 ENCOUNTER — Other Ambulatory Visit: Payer: Self-pay | Admitting: Family Medicine

## 2024-03-27 NOTE — Telephone Encounter (Signed)
 Discontinued 08/08/23.  Requested Prescriptions  Pending Prescriptions Disp Refills   MOUNJARO  2.5 MG/0.5ML Pen [Pharmacy Med Name: MOUNJARO  2.5 MG/0.5 ML PEN]      Sig: INJECT 2.5 MG SUBCUTANEOUSLY WEEKLY     Off-Protocol Failed - 03/27/2024 12:24 PM      Failed - Medication not assigned to a protocol, review manually.      Passed - Valid encounter within last 12 months    Recent Outpatient Visits           3 weeks ago Encounter for annual physical exam   [facility], Jon HERO, MD   5 months ago Need for diphtheria-tetanus-pertussis (Tdap) vaccine   [facility], Lauraine SAILOR, DO   7 months ago Hypertension associated with diabetes [facility])   [facility], Jon HERO, MD

## 2024-06-20 ENCOUNTER — Encounter: Payer: Self-pay | Admitting: Family Medicine

## 2024-06-21 ENCOUNTER — Other Ambulatory Visit (HOSPITAL_COMMUNITY): Payer: Self-pay

## 2024-06-21 ENCOUNTER — Telehealth: Payer: Self-pay

## 2024-06-21 NOTE — Telephone Encounter (Signed)
 [employer] [place] requested.  We don't have insurance that pulls for patient in Epic or the pharmacy system. I called [facility] to request the updated information but they are currently closed for lunch. Will attempt again shortly to request updated insurance info.     Sending mychart also.

## 2024-06-21 NOTE — Telephone Encounter (Signed)
 We don't have insurance that pulls for patient in Epic or the pharmacy system. I called [facility] to request the updated information but they are currently closed for lunch. Will attempt again shortly to request updated insurance info.

## 2024-06-21 NOTE — Telephone Encounter (Signed)
 Pharmacy Patient Advocate Encounter   Received notification from Physician's Office that prior authorization for MOUNJARO  is required/requested.   Insurance verification completed.   The patient is insured through [facility].   Per test claim: PA required; PA submitted to above mentioned insurance via Latent Key/confirmation #/EOC [id number] Status is pending

## 2024-06-21 NOTE — Telephone Encounter (Signed)
 Sounds like she needs a PA for the Mounjaro  - please send to the appropriate team

## 2024-06-22 NOTE — Telephone Encounter (Signed)
 Pharmacy Patient Advocate Encounter  Received notification from [facility] that Prior Authorization for MOUNJARO  has been APPROVED from 06/21/24 to 06/21/25

## 2024-06-25 ENCOUNTER — Other Ambulatory Visit (HOSPITAL_COMMUNITY): Payer: Self-pay

## 2024-08-30 ENCOUNTER — Ambulatory Visit: Admitting: Family Medicine
